# Patient Record
Sex: Male | Born: 1960 | Race: White | Hispanic: No | Marital: Married | Smoking: Never smoker
Health system: Southern US, Community
[De-identification: ages and names within clinical notes are randomized; demographics above are authoritative.]

## PROBLEM LIST (undated history)

## (undated) DIAGNOSIS — F429 Obsessive-compulsive disorder, unspecified: Secondary | ICD-10-CM

## (undated) DIAGNOSIS — I319 Disease of pericardium, unspecified: Secondary | ICD-10-CM

## (undated) HISTORY — DX: Obsessive-compulsive disorder, unspecified: F42.9

## (undated) HISTORY — PX: HEMORROIDECTOMY: SUR656

## (undated) HISTORY — DX: Disease of pericardium, unspecified: I31.9

## (undated) HISTORY — PX: KNEE SURGERY: SHX244

---

## 1998-03-12 ENCOUNTER — Inpatient Hospital Stay (HOSPITAL_COMMUNITY): Admission: EM | Admit: 1998-03-12 | Discharge: 1998-03-14 | Payer: Self-pay | Admitting: *Deleted

## 1998-03-26 ENCOUNTER — Ambulatory Visit (HOSPITAL_COMMUNITY): Admission: RE | Admit: 1998-03-26 | Discharge: 1998-03-26 | Payer: Self-pay | Admitting: Gastroenterology

## 2011-08-08 ENCOUNTER — Other Ambulatory Visit: Payer: Self-pay | Admitting: Occupational Medicine

## 2011-08-08 ENCOUNTER — Ambulatory Visit: Payer: Self-pay

## 2011-08-08 DIAGNOSIS — R52 Pain, unspecified: Secondary | ICD-10-CM

## 2011-11-21 ENCOUNTER — Encounter (HOSPITAL_BASED_OUTPATIENT_CLINIC_OR_DEPARTMENT_OTHER): Payer: Self-pay

## 2011-11-21 ENCOUNTER — Ambulatory Visit (HOSPITAL_BASED_OUTPATIENT_CLINIC_OR_DEPARTMENT_OTHER): Admit: 2011-11-21 | Payer: Self-pay | Admitting: Orthopedic Surgery

## 2011-11-21 SURGERY — ARTHROSCOPY, KNEE, WITH MEDIAL MENISCECTOMY
Anesthesia: Choice | Laterality: Left

## 2013-08-13 IMAGING — CR DG KNEE COMPLETE 4+V*L*
4 series · 4 of 4 positions shown · non-contrast
Comparison: None

CLINICAL DATA: Injured left knee.

LEFT KNEE - COMPLETE 4+ VIEW

[view not recorded (1 of 4)]
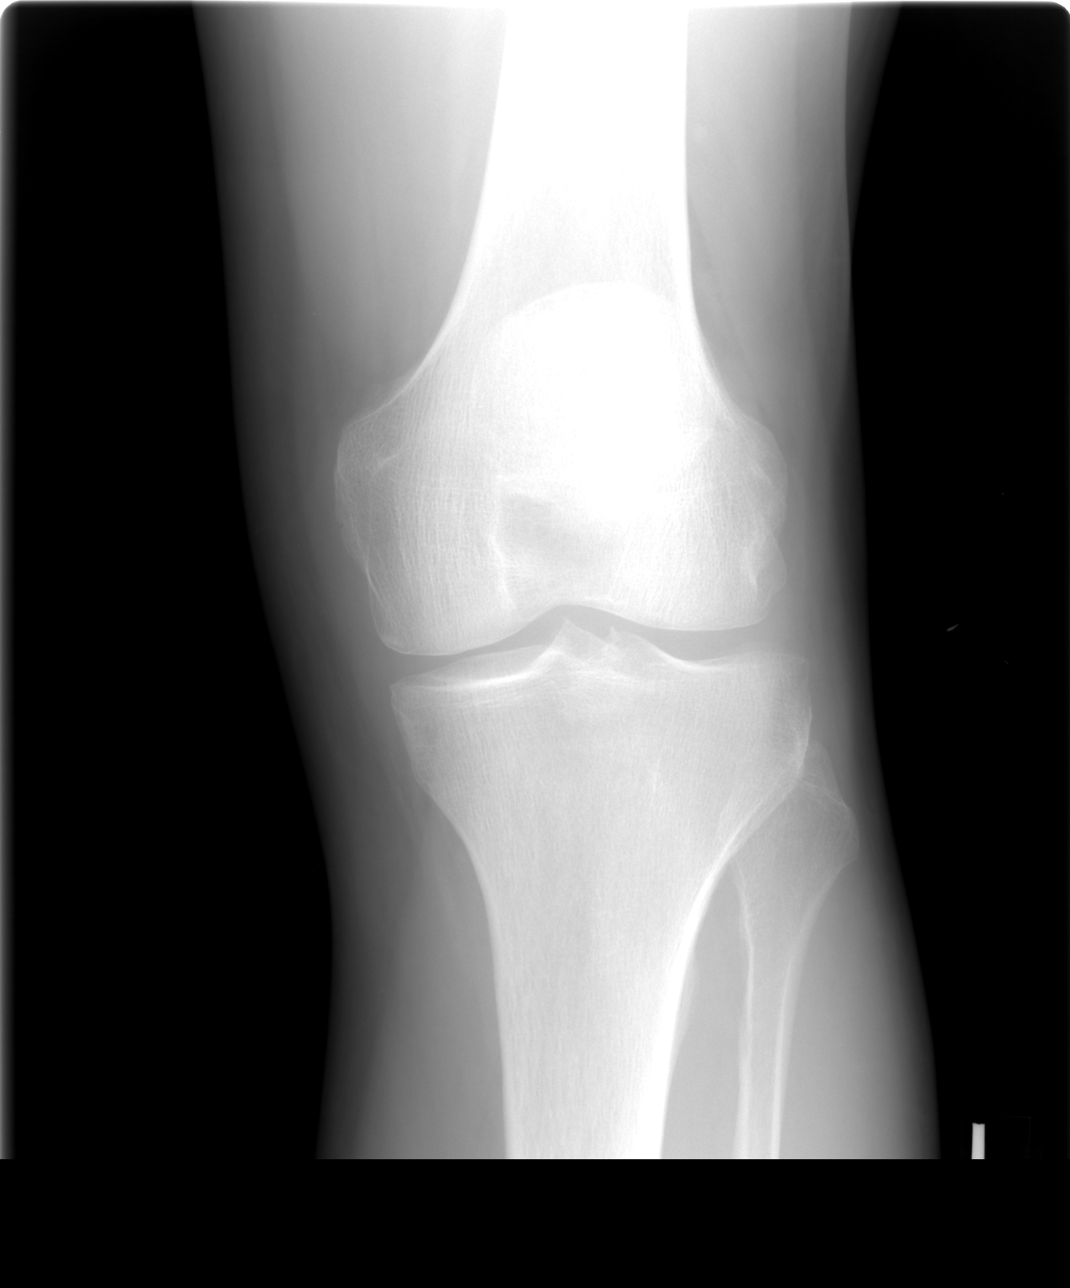

[view not recorded (2 of 4)]
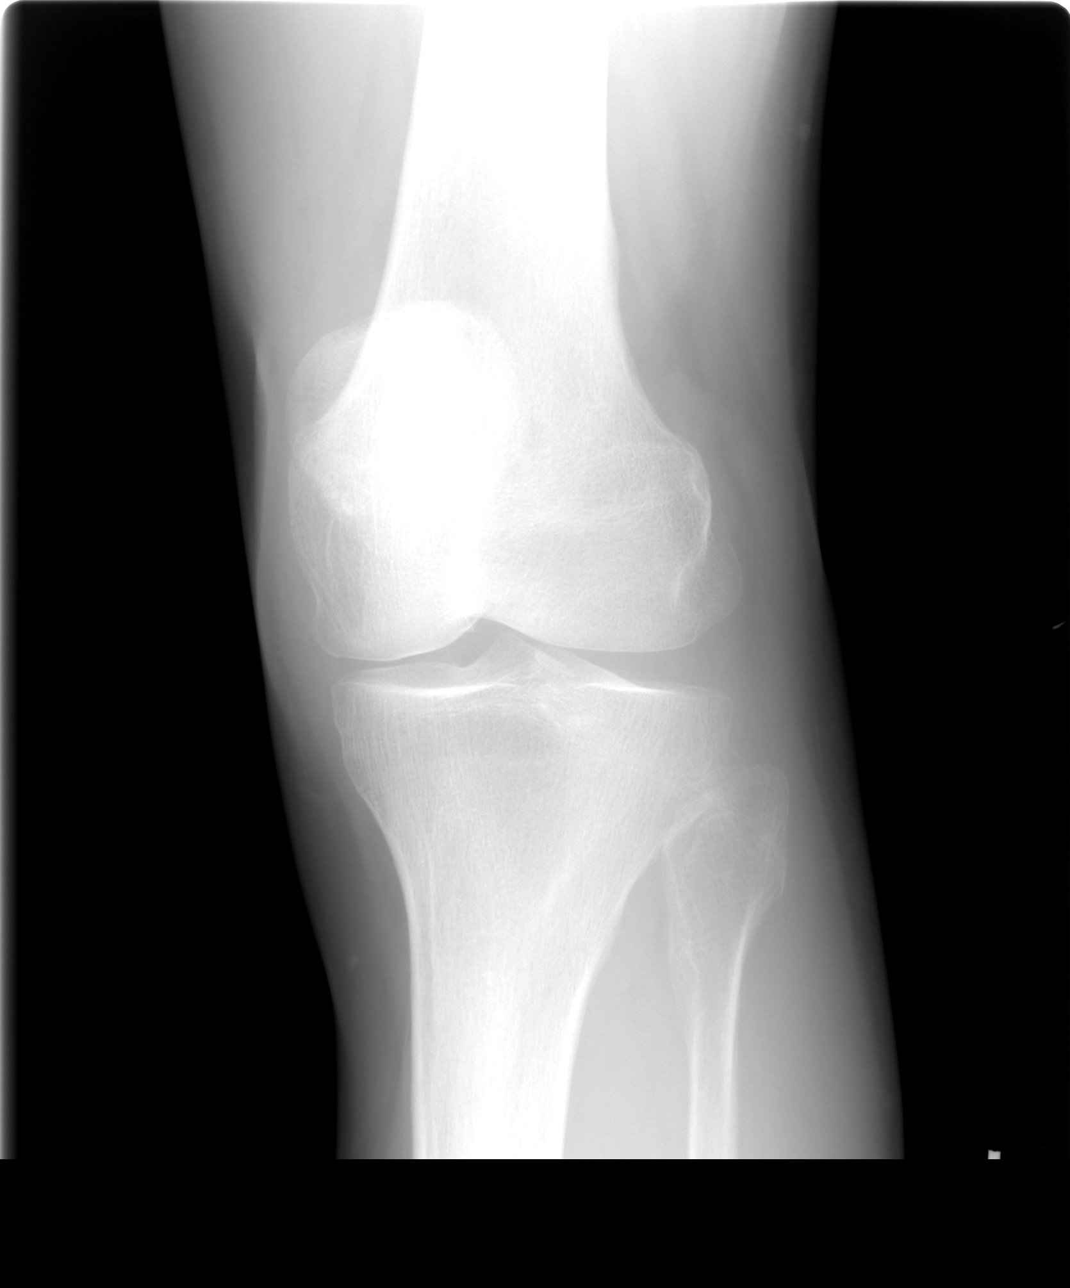

[view not recorded (3 of 4)]
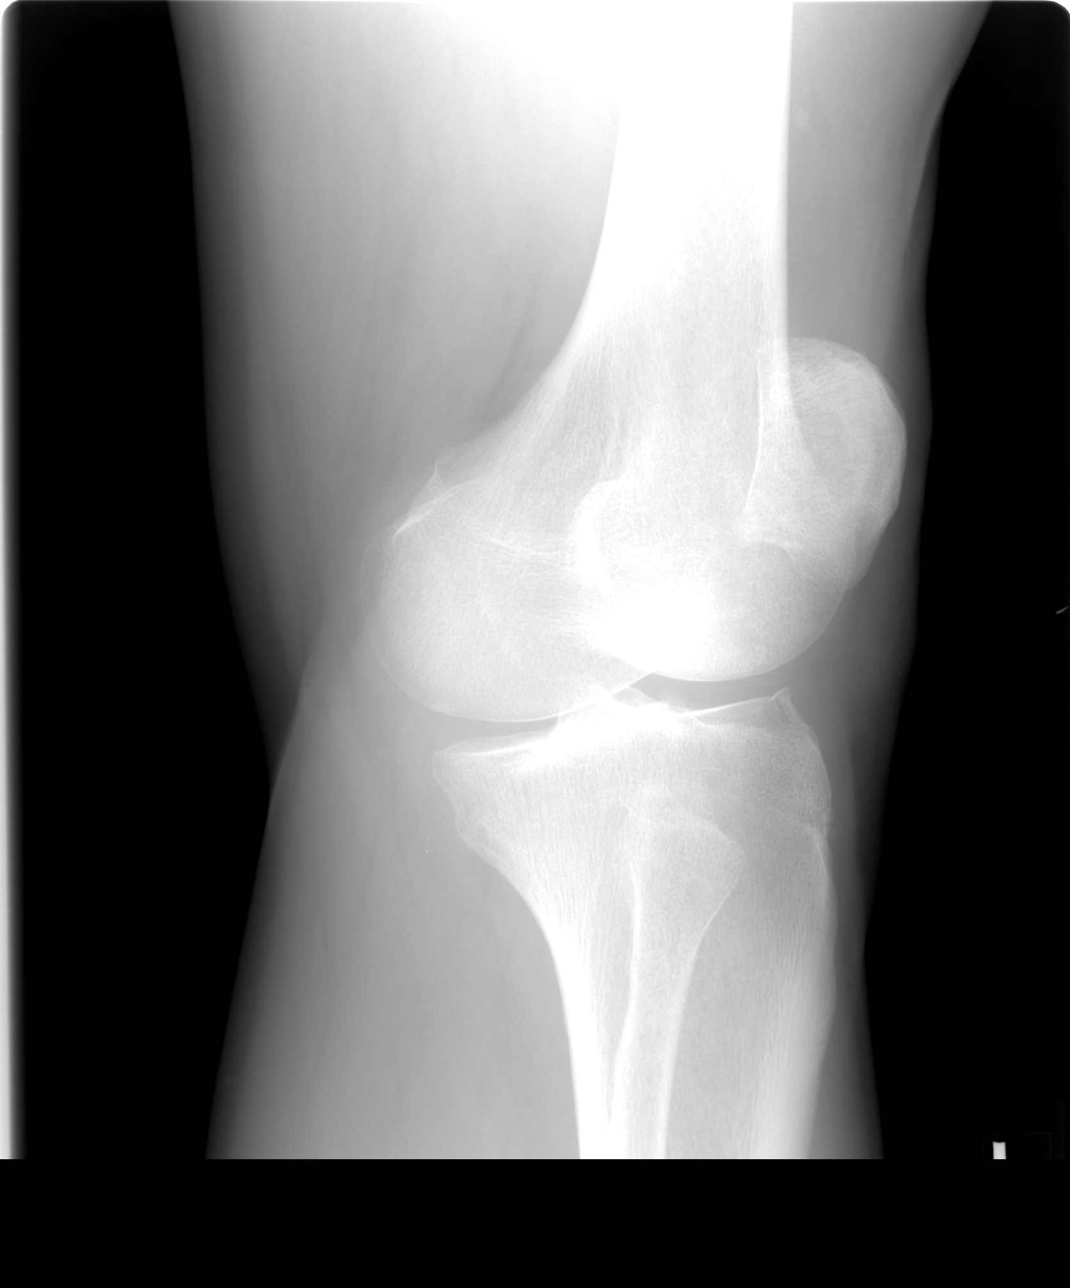

[view not recorded (4 of 4)]
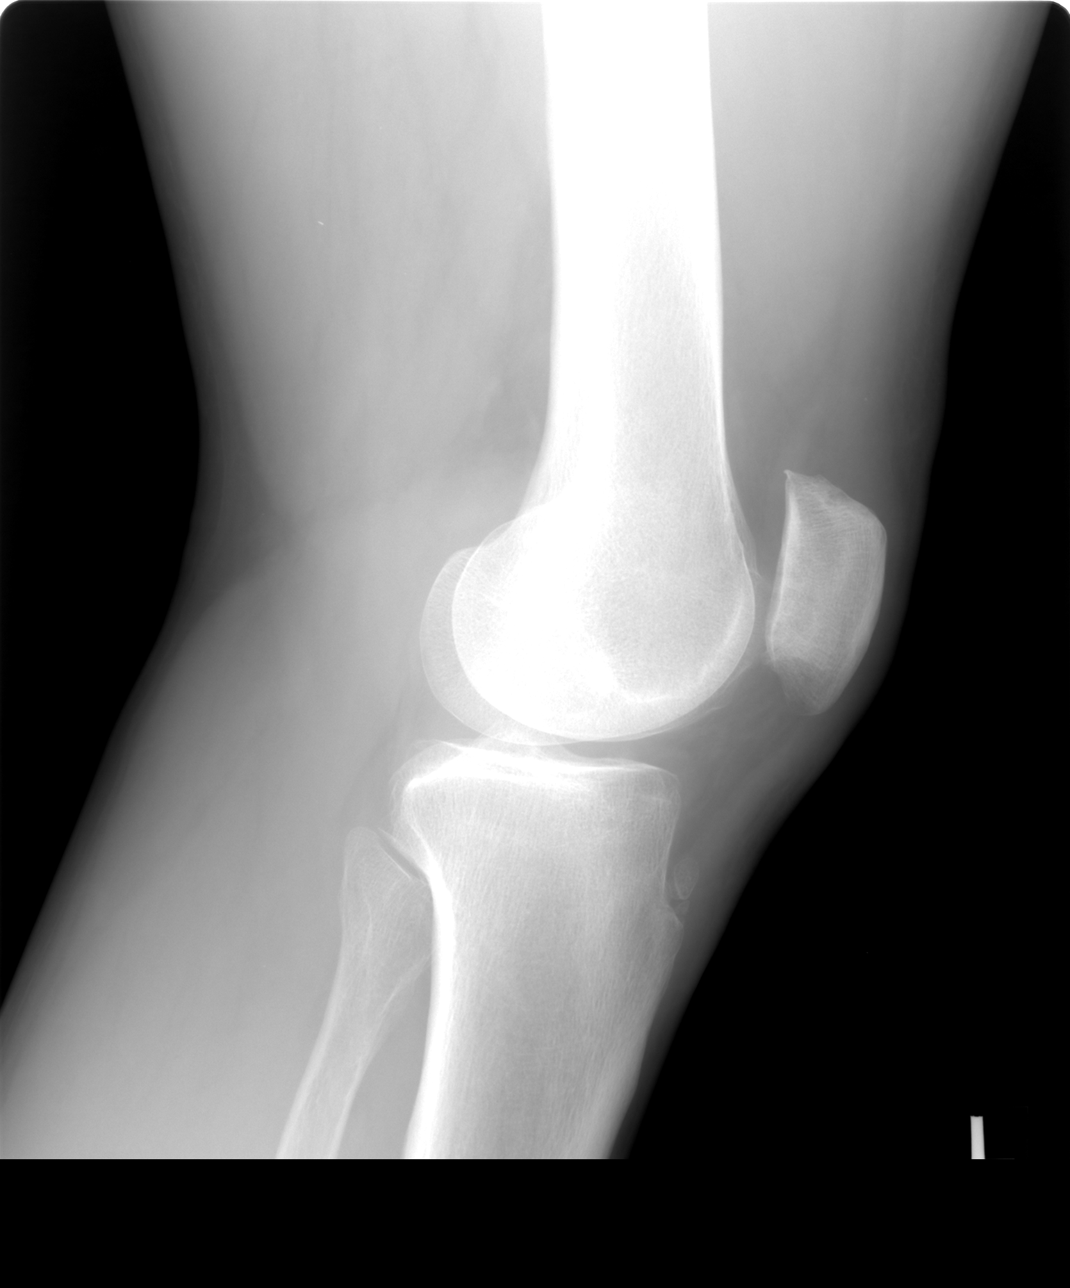

[4 of 4 positions shown; findings below may reference images not displayed]

FINDINGS: The joint spaces are maintained.  Minimal degenerative
changes.  No acute bony findings or osteochondral lesions.  No
definite joint effusion.
IMPRESSION: No acute bony findings.  Minimal degenerative changes.

## 2019-05-26 ENCOUNTER — Ambulatory Visit (INDEPENDENT_AMBULATORY_CARE_PROVIDER_SITE_OTHER): Payer: 59

## 2019-05-26 ENCOUNTER — Telehealth: Payer: Self-pay | Admitting: *Deleted

## 2019-05-26 ENCOUNTER — Ambulatory Visit: Payer: 59 | Admitting: Podiatry

## 2019-05-26 ENCOUNTER — Other Ambulatory Visit: Payer: Self-pay

## 2019-05-26 ENCOUNTER — Other Ambulatory Visit: Payer: Self-pay | Admitting: Podiatry

## 2019-05-26 VITALS — Temp 97.0°F

## 2019-05-26 DIAGNOSIS — M79672 Pain in left foot: Secondary | ICD-10-CM

## 2019-05-26 DIAGNOSIS — M21372 Foot drop, left foot: Secondary | ICD-10-CM

## 2019-05-26 DIAGNOSIS — S93402A Sprain of unspecified ligament of left ankle, initial encounter: Secondary | ICD-10-CM

## 2019-05-26 MED ORDER — DOXYCYCLINE HYCLATE 100 MG PO TABS: 100.0000 mg | ORAL_TABLET | Freq: Two times a day (BID) | ORAL | 0 refills | Status: DC

## 2019-05-26 NOTE — Telephone Encounter (Signed)
Hassan Rowan - [facility] states the Lymes Disease ordered by Dr. Jacqualyn Posey is for spinal fluid. I asked if the Lyme Disease, IgM, Early Test with Reflux was correct and she states that is the blood test.

## 2019-05-26 NOTE — Progress Notes (Addendum)
Subjective:   Patient ID: Laurence Slate, male   DOB: 58 y.o.   MRN: [id number]   HPI 58 year old male presents the office today for concerns of 2.5-week history of acute dropfoot.  Denies any injury or falls at the time. He states that he may have noticed a tick on his foot prior to noticing the dropfoot.  He has no back pain, hip pain, knee pain.  He does state that after he noticed a foot drop he was drinking 1 night he did twist his foot other than that no other injury at the time before the foot drop.  He states he has not had any fevers or chills.  No other falls.  No significant back pain.   Review of Systems  All other systems reviewed and are negative.  No past medical history on file.   Current Outpatient Medications:  .  fluvoxaMINE (LUVOX) 100 MG tablet, TK 3 TS PO QD, Disp: , Rfl:  .  doxycycline (VIBRA-TABS) 100 MG tablet, Take 1 tablet (100 mg total) by mouth 2 (two) times daily., Disp: 20 tablet, Rfl: 0  No Known Allergies      Objective:  Physical Exam  General: AAO x3, NAD  Dermatological: There is a small lesion on the dorsal aspect the left foot He had a tick bite but there is no edema, erythema, drainage or pus.  No signs of infection.  Vascular: Dorsalis Pedis artery and Posterior Tibial artery pedal pulses are 2/4 bilateral with immedate capillary fill time.  There is no pain with calf compression, swelling, warmth, erythema.   Neruologic: Sensation intact with Thornell Mule monofilament.  Vibratory sensation intact.  Musculoskeletal: There is no dorsiflexion present on the left ankle.  Dropfoot is present.  Otherwise some muscle strength intact.  There is no area of tenderness.  Minimal swelling to the lateral aspect of the foot he twisted his foot but no specific tenderness on palpation.  Minimal bruising.  Subjectively he states no pain to the back.  Gait: Unassisted, Nonantalgic.       Assessment:   58 year old male with left acute foot drop,  tick bite     Plan:  -Treatment options discussed including all alternatives, risks, and complications -Etiology of symptoms were discussed -X-rays were obtained and reviewed with the patient.  There is no evidence of acute fracture or stress fracture. -Ankle brace dispensed for now. -We will start doxycycline given tick bite. -Ordered lyme labs, ESR, CRP, CBC, BMP, ANA, RF -Urgent neurology consult; NCV -May need MRI of the back.   Trula Slade DPM

## 2019-05-26 NOTE — Addendum Note (Signed)
Addended by: Celesta Gentile R on: 05/26/2019 05:23 PM   Modules accepted: Orders

## 2019-05-26 NOTE — Addendum Note (Signed)
Addended by: Cranford Mon R on: 05/26/2019 10:27 AM   Modules accepted: Orders

## 2019-05-26 NOTE — Addendum Note (Signed)
Addended by: Harriett Sine D on: 05/26/2019 11:01 AM   Modules accepted: Orders

## 2019-05-26 NOTE — Telephone Encounter (Signed)
I cancelled the order I put in

## 2019-05-27 ENCOUNTER — Encounter: Payer: Self-pay | Admitting: Neurology

## 2019-05-27 ENCOUNTER — Other Ambulatory Visit: Payer: Self-pay

## 2019-05-27 ENCOUNTER — Ambulatory Visit (INDEPENDENT_AMBULATORY_CARE_PROVIDER_SITE_OTHER): Payer: 59 | Admitting: Neurology

## 2019-05-27 VITALS — BP 158/104 | HR 105 | Ht 72.0 in | Wt 200.0 lb

## 2019-05-27 DIAGNOSIS — M21372 Foot drop, left foot: Secondary | ICD-10-CM

## 2019-05-27 NOTE — Patient Instructions (Signed)
We will order nerve study of left leg/foot Further recommendations pending results.

## 2019-05-27 NOTE — Progress Notes (Signed)
NEUROLOGY CONSULTATION NOTE  Doristine MangoRichard B Mackie MRN: [id number] DOB: [birth year]  Referring provider: Ovid CurdMatthew Wagoner, DPM Primary care provider: No PCP  Reason for consult:  Left foot drop  HISTORY OF PRESENT ILLNESS: Mark House is a 58 year old Caucasian man who presents for acute onset left foot drop.  History supplemented by referring provider note.  He developed acute left foot drop about 2 1/2 weeks ago.  No preceding injury involving fall or back pain.  He was sitting on his porch for an extended period of time and noticed he was unable to move his foot.  He was probably sitting with his leg crossed.  He states he often sits with his leg crossed.  He denies radicular pain down the leg or weakness of the leg.  Maybe slight sensory changes on dorsum of foot.  No upper extremity symptoms.  Earlier that day, he was bit by a tick.  While walking, he sprained his ankle and saw the podiatrist.  X-ray of the foot yesterday was personally reviewed and demonstrated no fracture or other abnormality.  He has since been wearing a boot.  PAST MEDICAL HISTORY: Past Medical History:  Diagnosis Date  . OCD (obsessive compulsive disorder)   . Pericarditis     PAST SURGICAL HISTORY: None  MEDICATIONS: Current Outpatient Medications on File Prior to Visit  Medication Sig Dispense Refill  . doxycycline (VIBRA-TABS) 100 MG tablet Take 1 tablet (100 mg total) by mouth 2 (two) times daily. 20 tablet 0  . fluvoxaMINE (LUVOX) 100 MG tablet TK 3 TS PO QD     No current facility-administered medications on file prior to visit.     ALLERGIES: No Known Allergies  FAMILY HISTORY: Family History  Problem Relation Age of Onset  . Cancer - Lung Mother   . Hypertension Mother   . Stroke Mother   . Heart disease Father   . Kidney disease Father   . Cancer Maternal Grandmother     SOCIAL HISTORY: Social History   Socioeconomic History  . Marital status: Married    Spouse name: Not on  file  . Number of children: Not on file  . Years of education: Not on file  . Highest education level: Not on file  Occupational History  . Not on file  Social Needs  . Financial resource strain: Not on file  . Food insecurity    Worry: Not on file    Inability: Not on file  . Transportation needs    Medical: Not on file    Non-medical: Not on file  Tobacco Use  . Smoking status: Not on file  Substance and Sexual Activity  . Alcohol use: Not on file  . Drug use: Not on file  . Sexual activity: Not on file  Lifestyle  . Physical activity    Days per week: Not on file    Minutes per session: Not on file  . Stress: Not on file  Relationships  . Social Musicianconnections    Talks on phone: Not on file    Gets together: Not on file    Attends religious service: Not on file    Active member of club or organization: Not on file    Attends meetings of clubs or organizations: Not on file    Relationship status: Not on file  . Intimate partner violence    Fear of current or ex partner: Not on file    Emotionally abused: Not on file  Physically abused: Not on file    Forced sexual activity: Not on file  Other Topics Concern  . Not on file  Social History Narrative  . Not on file    REVIEW OF SYSTEMS: Constitutional: No fevers, chills, or sweats, no generalized fatigue, change in appetite Eyes: No visual changes, double vision, eye pain Ear, nose and throat: No hearing loss, ear pain, nasal congestion, sore throat Cardiovascular: No chest pain, palpitations Respiratory:  No shortness of breath at rest or with exertion, wheezes GastrointestinaI: No nausea, vomiting, diarrhea, abdominal pain, fecal incontinence Genitourinary:  No dysuria, urinary retention or frequency Musculoskeletal:  No neck pain, back pain Integumentary: No rash, pruritus, skin lesions Neurological: as above Psychiatric: No depression, insomnia, anxiety Endocrine: No palpitations, fatigue, diaphoresis, mood  swings, change in appetite, change in weight, increased thirst Hematologic/Lymphatic:  No purpura, petechiae. Allergic/Immunologic: no itchy/runny eyes, nasal congestion, recent allergic reactions, rashes  PHYSICAL EXAM: Blood pressure (!) 158/104, pulse (!) 105, height 6' (1.829 m), weight 200 lb (90.7 kg), SpO2 98 %. General: No acute distress.  Patient appears well-groomed.   Head:  Normocephalic/atraumatic Eyes:  fundi examined but not visualized Neck: supple, no paraspinal tenderness, full range of motion Back: No paraspinal tenderness Heart: regular rate and rhythm Lungs: Clear to auscultation bilaterally. Vascular: No carotid bruits. Neurological Exam: Mental status: alert and oriented to person, place, and time, recent and remote memory intact, fund of knowledge intact, attention and concentration intact, speech fluent and not dysarthric, language intact. Cranial nerves: CN I: not tested CN II: pupils equal, round and reactive to light, visual fields intact CN III, IV, VI:  full range of motion, no nystagmus, no ptosis CN V: facial sensation intact CN VII: upper and lower face symmetric CN VIII: hearing intact CN IX, X: gag intact, uvula midline CN XI: sternocleidomastoid and trapezius muscles intact CN XII: tongue midline Bulk & Tone: normal, no fasciculations. Motor:  Left foot drop.  Otherwise, 5/5 throughout including foot inversion Sensation:  Pinprick and vibration sensation intact.. Deep Tendon Reflexes:  2+ throughout except 3+ in patellars bilaterally, toes downgoing. Finger to nose testing:  Without dysmetria.   Heel to shin:  Without dysmetria.   Gait:  Left steppage gait.  Able to turn.  Romberg negative.  IMPRESSION: 1. Left foot drop.  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 Foot inversion intact, making L5 radiculopathy less likely.  PLAN: 1.  NCV-EMG of left lower extremity.  Should be scheduled at least next week. 2.   Continue wearing orthotic 3.  Further recommendations pending results.  Thank you for allowing me to take part in the care of this patient.  Metta Clines, DO  CC: Celesta Gentile, DPM

## 2019-05-28 LAB — C-REACTIVE PROTEIN: CRP: 8.3 mg/L — ABNORMAL HIGH (ref <8.0)

## 2019-05-28 LAB — CBC WITH DIFFERENTIAL/PLATELET
Absolute Monocytes: 693 cells/uL (ref 200–950)
Basophils Absolute: 28 cells/uL (ref 0–200)
Basophils Relative: 0.5 %
Eosinophils Absolute: 50 cells/uL (ref 15–500)
Eosinophils Relative: 0.9 %
HCT: 46.7 % (ref 38.5–50.0)
Hemoglobin: 16.9 g/dL (ref 13.2–17.1)
Lymphs Abs: 710 cells/uL — ABNORMAL LOW (ref 850–3900)
MCH: 34.5 pg — ABNORMAL HIGH (ref 27.0–33.0)
MCHC: 36.2 g/dL — ABNORMAL HIGH (ref 32.0–36.0)
MCV: 95.3 fL (ref 80.0–100.0)
MPV: 10.3 fL (ref 7.5–12.5)
Monocytes Relative: 12.6 %
Neutro Abs: 4021 cells/uL (ref 1500–7800)
Neutrophils Relative %: 73.1 %
Platelets: 144 10*3/uL (ref 140–400)
RBC: 4.9 10*6/uL (ref 4.20–5.80)
RDW: 12.5 % (ref 11.0–15.0)
Total Lymphocyte: 12.9 %
WBC: 5.5 10*3/uL (ref 3.8–10.8)

## 2019-05-28 LAB — BASIC METABOLIC PANEL
BUN: 19 mg/dL (ref 7–25)
CO2: 25 mmol/L (ref 20–32)
Calcium: 10 mg/dL (ref 8.6–10.3)
Chloride: 103 mmol/L (ref 98–110)
Creat: 1.33 mg/dL (ref 0.70–1.33)
Glucose, Bld: 93 mg/dL (ref 65–99)
Potassium: 4.1 mmol/L (ref 3.5–5.3)
Sodium: 140 mmol/L (ref 135–146)

## 2019-05-28 LAB — RHEUMATOID FACTOR: Rhuematoid fact SerPl-aCnc: <14 IU/mL (ref <14)

## 2019-05-28 LAB — B. BURGDORFI ANTIBODIES: B burgdorferi Ab IgG+IgM: <0.9 index

## 2019-05-28 LAB — SEDIMENTATION RATE: Sed Rate: 2 mm/h (ref 0–20)

## 2019-05-28 LAB — ANA: Anti Nuclear Antibody (ANA): NEGATIVE

## 2019-05-30 NOTE — Telephone Encounter (Signed)
I called pt and he asked if he needed to continue the doxycycline, which he was given due exposure to tick.

## 2019-05-30 NOTE — Telephone Encounter (Signed)
[place] - Dr. Orpah Melter office receptionist states fax labs to [phone]. Faxed labs as directed.

## 2019-05-30 NOTE — Telephone Encounter (Signed)
Pt states he received the message with the results but has some questions.

## 2019-05-30 NOTE — Telephone Encounter (Signed)
-----   Message from Trula Slade, DPM sent at 05/29/2019 12:15 PM EDT ----- Val- please let him know that the lab work is normal. The only thing is that his CRP is very mildly elevated which is nonspecific. Continue neurology follow-up, NCV

## 2019-05-30 NOTE — Telephone Encounter (Signed)
Left message with Dr. Leigh Aurora review of results and instructions.

## 2019-05-30 NOTE — Telephone Encounter (Signed)
I informed pt Dr. Jacqualyn Posey stated the Lyme Disease result was negative and he did not need to continue the doxycycline.

## 2019-06-03 ENCOUNTER — Other Ambulatory Visit: Payer: Self-pay

## 2019-06-03 ENCOUNTER — Ambulatory Visit: Payer: 59 | Admitting: Podiatry

## 2019-06-03 ENCOUNTER — Encounter: Payer: Self-pay | Admitting: Podiatry

## 2019-06-03 VITALS — Temp 97.2°F

## 2019-06-03 DIAGNOSIS — M21372 Foot drop, left foot: Secondary | ICD-10-CM | POA: Diagnosis not present

## 2019-06-05 ENCOUNTER — Encounter: Payer: 59 | Admitting: Neurology

## 2019-06-10 NOTE — Progress Notes (Signed)
Subjective: 58 year old male presents the office today for evaluation of foot drop.  He has seen neurology edema.  Is caused by compression and crossing his legs.  He also presents today for follow-up evaluation he states he is unchanged not having any pain.  No weakness or falls.  States that he does activities.  Patient states discussed lab results as well. Denies any systemic complaints such as fevers, chills, nausea, vomiting. No acute changes since last appointment, and no other complaints at this time.   Objective: AAO x3, NAD DP/PT pulses palpable bilaterally, CRT less than 3 seconds Drop foot is present on the left side. There is no dorsiflexion noted. No pain. No swelling, erythema. No other new concerns and exam is grossly unchanged.  No open lesions or pre-ulcerative lesions.  No pain with calf compression, swelling, warmth, erythema  Assessment: Left foot, drop foot  Plan: -All treatment options discussed with the patient including all alternatives, risks, complications.  -He is follow-up with neurology.  Discussed blood work results with him today.  Continue neurology evaluation scheduled for nerve conduction test next week.  Discussed physical therapy.  Will await neurology evaluation before starting. -Patient encouraged to call the office with any questions, concerns, change in symptoms.   Trula Slade DPM

## 2019-06-19 ENCOUNTER — Ambulatory Visit (INDEPENDENT_AMBULATORY_CARE_PROVIDER_SITE_OTHER): Payer: 59 | Admitting: Neurology

## 2019-06-19 ENCOUNTER — Other Ambulatory Visit: Payer: Self-pay

## 2019-06-19 DIAGNOSIS — G5732 Lesion of lateral popliteal nerve, left lower limb: Secondary | ICD-10-CM

## 2019-06-19 DIAGNOSIS — M21372 Foot drop, left foot: Secondary | ICD-10-CM | POA: Diagnosis not present

## 2019-06-19 NOTE — Procedures (Signed)
[facility]  [address], [address]  [place], [place] [address] Tel: [phone] Fax:  [phone] Test Date:  06/19/2019  Patient: Mark House DOB: [birth year] Physician: Nita Sickleonika Patel, DO  Sex: Male Height: 6\' 0"  Ref Phys: Shon MilletAdam Jaffe, DO  ID#: [id number] Temp: 34.0C Technician:    Patient Complaints: This is a 58 year old man referred for evaluation of left foot drop which started in late May 2020.  NCV & EMG Findings: Extensive electrodiagnostic testing of the left lower extremity and additional studies of the right shows:  1. Left superficial peroneal sensory response, although within normal limits, is asymmetrically reduced as compared to the right.  Left sural sensory responses within normal limits. 2. Left peroneal motor response at the extensor digitorum brevis and tibialis anterior shows conduction block and markedly slowed conduction velocity at the fibular head (Poplt-B Fib, 21 - 26 m/s).  Left tibial and right peroneal motor responses are within normal limits. 3. Left tibial H reflex study is within normal limits. 4. Active on chronic motor axonal loss changes are seen affecting the anterior tibialis, extensor hallucis longus, and fibularis longus muscles on the left.  Impression: 1. 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 2. There is no evidence of a lumbosacral radiculopathy or sensorimotor polyneuropathy affecting the left lower extremity.   ___________________________ Nita Sickleonika Patel, DO    Nerve Conduction Studies Anti Sensory Summary Table   Site NR Peak (ms) Norm Peak (ms) P-T Amp (V) Norm P-T Amp  Left Sup Peroneal Anti Sensory (Ant Lat Mall)  34C  12 cm    3.2 <4.6 5.3 >4  Right Sup Peroneal Anti Sensory (Ant Lat Mall)  34C  12 cm    2.6 <4.6 11.7 >4  Left Sural Anti Sensory (Lat Mall)  34C  Calf    4.3 <4.6 9.0 >4   Motor Summary Table   Site NR Onset (ms) Norm Onset (ms) O-P Amp (mV) Norm O-P Amp Site1 Site2 Delta-0 (ms) Dist (cm) Vel (m/s) Norm Vel (m/s)  Left Peroneal Motor (Ext Dig Brev)  34C  Ankle    4.3 <6.0 5.7 >2.5 B Fib Ankle 7.5 37.0 49 >40  B Fib    11.8  5.2  Poplt B Fib 4.2 9.0 21 >40  Poplt    16.0  1.0         Right Peroneal Motor (Ext Dig Brev)  34C  Ankle    4.2 <6.0 6.8 >2.5 B Fib Ankle 8.8 40.0 45 >40  B Fib    13.0  6.3  Poplt B Fib 1.7 9.0 53 >40  Poplt    14.7  6.1         Left Peroneal TA Motor (Tib Ant)  34C  Fib Head    2.0 <4.5 4.0 >3 Poplit Fib Head 3.5 9.0 26 >40  Poplit    5.5  0.6         Right Peroneal TA Motor (Tib Ant)  34C  Fib Head    4.2 <4.5 5.2 >3 Poplit Fib Head 1.3 8.0 62 >40  Poplit    5.5  5.1         Left Tibial Motor (Abd Hall Brev)  34C  Ankle    4.8 <6.0 6.5 >4 Knee Ankle 10.0 43.0 43 >40  Knee    14.8  4.4          H Reflex Studies  NR H-Lat (ms) Lat Norm (ms) L-R H-Lat (ms)  Left Tibial (Gastroc)  34C     33.88 <35    EMG   Side Muscle Ins Act Fibs Psw Fasc Number Recrt Dur Dur. Amp Amp. Poly Poly. Comment  Left Lumbo Parasp Low Nml Nml Nml Nml NE - - - - - - - N/A  Left AntTibialis Nml 2+ Nml Nml SMU Rapid All 1+ All 1+ All 1+ N/A  Left ExtHallLong Nml 1+ Nml Nml SMU Rapid Nml Nml Nml Nml Nml Nml N/A  Left Flex Dig Long Nml Nml Nml Nml Nml Nml Nml Nml Nml Nml Nml Nml N/A  Left Fibularis Long Nml 1+ Nml Nml 1- Rapid Nml Nml Nml Nml Nml Nml N/A  Left BicepsFemS Nml Nml Nml Nml Nml Nml Nml Nml Nml Nml Nml Nml N/A  Left GluteusMed Nml Nml Nml Nml Nml Nml Nml Nml Nml Nml Nml Nml N/A  Left Gastroc Nml Nml Nml Nml Nml Nml Nml Nml Nml Nml Nml Nml N/A      Waveforms:

## 2019-06-20 ENCOUNTER — Telehealth: Payer: Self-pay

## 2019-06-20 DIAGNOSIS — M21372 Foot drop, left foot: Secondary | ICD-10-CM

## 2019-06-20 NOTE — Telephone Encounter (Signed)
-----   Message from Pieter Partridge, DO sent at 06/20/2019 12:06 PM EDT ----- Nerve test does show that the nerve is pinched at the knee (likely from crossing his legs).  He is to not cross his legs.  I would like to refer him to physical therapy for foot drop.  I would like him to follow up with me in 2 to 3 months.

## 2019-06-20 NOTE — Telephone Encounter (Signed)
Called and advised Pt of results. Sent rehab order to [facility]. Made follow up appt for Pt.

## 2019-06-24 ENCOUNTER — Ambulatory Visit: Payer: 59 | Admitting: Rehabilitative and Restorative Service Providers"

## 2019-06-24 ENCOUNTER — Encounter: Payer: Self-pay | Admitting: Rehabilitative and Restorative Service Providers"

## 2019-06-24 ENCOUNTER — Other Ambulatory Visit: Payer: Self-pay

## 2019-06-24 DIAGNOSIS — M6281 Muscle weakness (generalized): Secondary | ICD-10-CM

## 2019-06-24 DIAGNOSIS — R29898 Other symptoms and signs involving the musculoskeletal system: Secondary | ICD-10-CM | POA: Diagnosis not present

## 2019-06-24 DIAGNOSIS — R2689 Other abnormalities of gait and mobility: Secondary | ICD-10-CM

## 2019-06-24 NOTE — Therapy (Signed)
[facility] [place], [place], [address] Phone: [phone]   Fax:  [phone]  Physical Therapy Evaluation  Patient Details  Name: Mark House MRN: [id number] Date of Birth: [birth year] Referring Provider (PT): Dr Metta Clines   Encounter Date: 06/24/2019  PT End of Session - 06/24/19 1305    Visit Number  1    Number of Visits  12    Date for PT Re-Evaluation  08/05/19    PT Start Time  1202    PT Stop Time  1252    PT Time Calculation (min)  50 min    Activity Tolerance  Patient tolerated treatment well       Past Medical History:  Diagnosis Date  . OCD (obsessive compulsive disorder)   . Pericarditis     Past Surgical History:  Procedure Laterality Date  . HEMORROIDECTOMY    . KNEE SURGERY Bilateral     There were no vitals filed for this visit.   Subjective Assessment - 06/24/19 1207    Subjective  Patient reports that he noticed inability to lift Lt ankle 05/10/2019 with no known injury. Symptoms persisted and he was referred to neurologist. NCV testing shows Lt common peroneal dysfunction at the fibular head with demyelinating type - no evidence of lumbar radiculopathy    Pertinent History  pericarditis 1997; similar symptoms 1999; OCD; bilat knee surgeries - Rt loose body removed 1979; Lt knee arthroscopy for torn meniscus 2014    Diagnostic tests  NCV    Patient Stated Goals  get ankle moving - flex ankle    Currently in Pain?  --   sometimes pain in the Lt great toe        [facility] PT Assessment - 06/24/19 0001      Assessment   Medical Diagnosis  Lt foot drop     Referring Provider (PT)  Dr Metta Clines    Onset Date/Surgical Date  05/10/19    Hand Dominance  Right    Next MD Visit  09/2019    Prior Therapy  none       Precautions   Precautions  None      Restrictions   Weight Bearing Restrictions  No      Balance Screen   Has the patient fallen in the past 6 months  No     Has the patient had a decrease in activity level because of a fear of falling?   No    Is the patient reluctant to leave their home because of a fear of falling?   No      Home Film/video editor residence    Living Arrangements  Spouse/significant other    Home Access  Stairs to enter    Entrance Stairs-Number of Steps  5    Entrance Stairs-Rails  None    Home Layout  Multi-level      Prior Function   Level of Independence  Independent    Vocation  Retired;Part time employment    Vocation Requirements  works PT at [employer] in [place]    sitting; driving; walking    Leisure  yard work - riding and pushing; gardening; gym 5-6 days/wk cardio and wts (not since [place] [address] shut down)       Sensation   Additional Comments  WFL's per pt report       Posture/Postural Control   Posture Comments  head forward; shoudlers rounded; flexed  forward at hips       AROM   Left Ankle Dorsiflexion  -14    Left Ankle Plantar Flexion  --   WFL's    Left Ankle Inversion  --   WFL's    Left Ankle Eversion  --   limited compared to Rt      Strength   Overall Strength Comments  WFL's bilat hips and knee; Rt ankle     Left Ankle Dorsiflexion  2-/5    Left Ankle Plantar Flexion  5/5    Left Ankle Inversion  5/5    Left Ankle Eversion  3+/5      Flexibility   Hamstrings  mild tightness Lt compared to Rt    ITB  WFL's bilat    Piriformis  tight Lt > Rt       Palpation   Palpation comment  tender to palpation lateral leg at head of fibula       Ambulation/Gait   Ambulation/Gait  Yes    Ambulation/Gait Assistance  7: Independent    Ambulation Distance (Feet)  40 Feet    Assistive device  None    Gait Pattern  Left steppage    Ambulation Surface  Level    Gait Comments  decreased Lt foot DF in swing phase of gait                 Objective measurements completed on examination: See above findings.      [facility] Adult PT Treatment/Exercise - 06/24/19 0001       Electrical Stimulation   Electrical Stimulation Location  lateral leg     Electrical Stimulation Action  NMES    Electrical Stimulation Parameters  to tolerance    Electrical Stimulation Goals  Strength      Ankle Exercises: Stretches   Other Stretch  piriformis stretch Lt supine travell 30 sec x 3 reps       Ankle Exercises: Seated   Toe Raise  10 reps      Ankle Exercises: Supine   Other Supine Ankle Exercises  ankle DF with NEMS x 10 min     Other Supine Ankle Exercises  ankle pumps/circles CW and CCW//alphabet              PT Education - 06/24/19 1247    Education Details  HEP    Person(s) Educated  Patient    Methods  Explanation;Demonstration;Tactile cues;Verbal cues;Handout    Comprehension  Verbalized understanding;Returned demonstration;Verbal cues required;Tactile cues required          PT Long Term Goals - 06/24/19 1311      PT LONG TERM GOAL #1   Title  Increase AROM Lt ankle DF to 0 to 5 deg 08/05/2019    Time  6    Period  Weeks    Status  New      PT LONG TERM GOAL #2   Title  Increase strength Lt ankle DF to 3+/5 to 4-/5 to improve function and gait pattern 08/05/2019    Time  6    Period  Weeks    Status  New      PT LONG TERM GOAL #3   Title  improve agait pattern with patient to demonstrate decreased steppage gait pattern8/25/2020    Time  6    Period  Weeks    Status  New      PT LONG TERM GOAL #4   Title  Independent in HEP 08/05/2019  Time  6    Period  Weeks    Status  New             Plan - 06/24/19 1306    Clinical Impression Statement  Patient presents with signs and symptoms consistent with peroneal nerve injury resulting in Lt foot drop. Symptoms started suddendly upon standing and walking 05/10/2019 with no known injury. NCV confirms nerve injury. Patient has limited AROM and decresaed strenght Lt ankle as well as abnormal gait pattern. He will benefit from PT to address problems identified.    Stability/Clinical  Decision Making  Stable/Uncomplicated    Clinical Decision Making  Low    Rehab Potential  Good    PT Frequency  2x / week    PT Duration  6 weeks    PT Treatment/Interventions  Patient/family education;ADLs/Self Care Home Management;Cryotherapy;Electrical Stimulation;Iontophoresis 4mg /ml Dexamethasone;Moist Heat;Ultrasound;Dry needling;Manual techniques;Neuromuscular re-education;Gait training;Stair training;Functional mobility training;Therapeutic activities;Therapeutic exercise;Balance training    PT Next Visit Plan  review HEP; stretch gastroc/soleus; progress with SLS; continue wiht NMES and work on unit for home use    PT Home Exercise Plan  [id number]    Consulted and Agree with Plan of Care  Patient       Patient will benefit from skilled therapeutic intervention in order to improve the following deficits and impairments:  Decreased strength, Decreased range of motion, Decreased mobility, Abnormal gait  Visit Diagnosis: 1. Muscle weakness (generalized)   2. Ankle weakness   3. Other abnormalities of gait and mobility        Problem List There are no active problems to display for this patient.   Celyn Rober MinionP Holt PT, MPH  06/24/2019, 1:19 PM  [facility]  [address] [place], [place], [address] Phone: [phone]   Fax:  [phone]  Name: Mark House MRN: [id number] Date of Birth: June 12, 1961

## 2019-06-24 NOTE — Patient Instructions (Signed)
Access Code: [email or web address]  URL: [email or web address]  Date: 06/24/2019  Prepared by: Gillermo Murdoch   Exercises  Seated Toe Taps - 10 reps - 1-2 sets - 2 sec hold - 2x daily - 7x weekly  Seated Toe Raise - 10 reps - 1-2 sets - 2 sec hold - 2x daily - 7x weekly  Supine Piriformis Stretch with Foot on Ground - 3 reps - 1 sets - 30 sec hold - 2x daily - 7x weekly  Ankle Pumps in Elevation - 10 reps - 1 sets - 2x daily - 7x weekly  Ankle Circles in Elevation - 10 reps - 1 sets - 2x daily - 7x weekly  Ankle Alphabet in Elevation - 1 reps - 1 sets - 2x daily - 7x weekly

## 2019-06-26 ENCOUNTER — Ambulatory Visit: Payer: 59 | Admitting: Rehabilitative and Restorative Service Providers"

## 2019-06-26 ENCOUNTER — Other Ambulatory Visit: Payer: Self-pay

## 2019-06-26 ENCOUNTER — Encounter: Payer: Self-pay | Admitting: Rehabilitative and Restorative Service Providers"

## 2019-06-26 ENCOUNTER — Encounter (INDEPENDENT_AMBULATORY_CARE_PROVIDER_SITE_OTHER): Payer: Self-pay

## 2019-06-26 DIAGNOSIS — R29898 Other symptoms and signs involving the musculoskeletal system: Secondary | ICD-10-CM | POA: Diagnosis not present

## 2019-06-26 DIAGNOSIS — R2689 Other abnormalities of gait and mobility: Secondary | ICD-10-CM | POA: Diagnosis not present

## 2019-06-26 DIAGNOSIS — M6281 Muscle weakness (generalized): Secondary | ICD-10-CM

## 2019-06-26 NOTE — Therapy (Signed)
[facility] [address] [place] [address] [place], [place], [address] Phone: [phone]   Fax:  [phone]  Physical Therapy Treatment  Patient Details  Name: Mark House MRN: [id number] Date of Birth: July 24, 1961 Referring Provider (PT): Dr Shon MilletAdam Jaffe   Encounter Date: 06/26/2019  PT End of Session - 06/26/19 1007    Visit Number  2    Number of Visits  12    Date for PT Re-Evaluation  08/05/19    PT Start Time  1000    PT Stop Time  1042    PT Time Calculation (min)  42 min    Activity Tolerance  Patient tolerated treatment well       Past Medical History:  Diagnosis Date  . OCD (obsessive compulsive disorder)   . Pericarditis     Past Surgical History:  Procedure Laterality Date  . HEMORROIDECTOMY    . KNEE SURGERY Bilateral     There were no vitals filed for this visit.  Subjective Assessment - 06/26/19 1008    Subjective  Doing exercises at home - questions about the NEMS unit for home.    Currently in Pain?  No/denies                       [facility] Treatment/Exercise - 06/26/19 0001      Electrical Stimulation   Electrical Stimulation Location  lateral leg     Electrical Stimulation Action  NMES    Electrical Stimulation Parameters  to tolerance    Electrical Stimulation Goals  Strength      Ankle Exercises: Stretches   Soleus Stretch  2 reps;30 seconds    Gastroc Stretch  2 reps;30 seconds    Other Stretch  piriformis stretch Lt supine travell 30 sec x 3 reps       Ankle Exercises: Aerobic   Nustep  L5 x 5 min       Ankle Exercises: Standing   Vector Stance  --   reaching forward to touch treatment table 8-10 reps   SLS  30 sec x 3 each LE       Ankle Exercises: Supine   Other Supine Ankle Exercises  ankle DF with NEMS x 10 min     Other Supine Ankle Exercises  ankle pumps/circles CW and CCW//alphabet              PT Education - 06/26/19 1040    Education Details   HEP; discussed "foot up" brace for ankle DF    Person(s) Educated  Patient    Methods  Explanation;Demonstration;Tactile cues;Verbal cues;Handout    Comprehension  Verbalized understanding;Returned demonstration;Verbal cues required;Tactile cues required          PT Long Term Goals - 06/24/19 1311      PT LONG TERM GOAL #1   Title  Increase AROM Lt ankle DF to 0 to 5 deg 08/05/2019    Time  6    Period  Weeks    Status  New      PT LONG TERM GOAL #2   Title  Increase strength Lt ankle DF to 3+/5 to 4-/5 to improve function and gait pattern 08/05/2019    Time  6    Period  Weeks    Status  New      PT LONG TERM GOAL #3   Title  improve agait pattern with patient to demonstrate decreased steppage gait pattern8/25/2020    Time  6    Period  Weeks    Status  New      PT LONG TERM GOAL #4   Title  Independent in HEP 08/05/2019    Time  6    Period  Weeks    Status  New            Plan - 06/26/19 1008    Clinical Impression Statement  Demonstrated all exercises well and added additional exercises without difficulty. Discussed possible trial of "foot up" brace - will attempt to borrow brace for trial next week. Patient considering portable NEMS device for home.    Stability/Clinical Decision Making  Stable/Uncomplicated    Rehab Potential  Good    PT Frequency  2x / week    PT Duration  6 weeks    PT Treatment/Interventions  Patient/family education;ADLs/Self Care Home Management;Cryotherapy;Electrical Stimulation;Iontophoresis 4mg /ml Dexamethasone;Moist Heat;Ultrasound;Dry needling;Manual techniques;Neuromuscular re-education;Gait training;Stair training;Functional mobility training;Therapeutic activities;Therapeutic exercise;Balance training    PT Next Visit Plan  review HEP; continue NMES and work on unit for home use; trial of foot up brace    PT [place] and Agree with Plan of Care  Patient       Patient will benefit from skilled  therapeutic intervention in order to improve the following deficits and impairments:  Decreased strength, Decreased range of motion, Decreased mobility, Abnormal gait  Visit Diagnosis: 1. Muscle weakness (generalized)   2. Ankle weakness   3. Other abnormalities of gait and mobility        Problem List There are no active problems to display for this patient.   [place], MPH  06/26/2019, 10:59 AM  [facility] [place], [place], [address] Phone: [phone]   Fax:  [phone]  Name: Mark House MRN: [id number] Date of Birth: September 05, 1961

## 2019-06-26 NOTE — Patient Instructions (Signed)
Access Code: [email or web address]  URL: [email or web address]  Date: 06/26/2019  Prepared by: Gillermo Murdoch   Exercises  Seated Toe Taps - 10 reps - 1-2 sets - 2 sec hold - 2x daily - 7x weekly  Seated Toe Raise - 10 reps - 1-2 sets - 2 sec hold - 2x daily - 7x weekly  Supine Piriformis Stretch with Foot on Ground - 3 reps - 1 sets - 30 sec hold - 2x daily - 7x weekly  Ankle Pumps in Elevation - 10 reps - 1 sets - 2x daily - 7x weekly  Ankle Circles in Elevation - 10 reps - 1 sets - 2x daily - 7x weekly  Ankle Alphabet in Elevation - 1 reps - 1 sets - 2x daily - 7x weekly  Gastroc Stretch on Wall - 3 reps - 1 sets - 30 sec hold - 2x daily - 7x weekly  Soleus Stretch on Wall - 3 reps - 1 sets - 30 sec hold - 2x daily - 7x weekly  Standing Single Leg Stance with Unilateral Counter Support - 3-5 reps - 1 sets - 30 sec hold - 2x daily - 7x weekly  Forward Reach - 10 reps - 1 sets - 10 sec hold - 2x daily - 7x weekly

## 2019-07-01 ENCOUNTER — Ambulatory Visit (INDEPENDENT_AMBULATORY_CARE_PROVIDER_SITE_OTHER): Payer: 59 | Admitting: Rehabilitative and Restorative Service Providers"

## 2019-07-01 ENCOUNTER — Encounter: Payer: Self-pay | Admitting: Rehabilitative and Restorative Service Providers"

## 2019-07-01 ENCOUNTER — Other Ambulatory Visit: Payer: Self-pay

## 2019-07-01 DIAGNOSIS — M6281 Muscle weakness (generalized): Secondary | ICD-10-CM

## 2019-07-01 DIAGNOSIS — R2689 Other abnormalities of gait and mobility: Secondary | ICD-10-CM

## 2019-07-01 DIAGNOSIS — R29898 Other symptoms and signs involving the musculoskeletal system: Secondary | ICD-10-CM | POA: Diagnosis not present

## 2019-07-01 NOTE — Patient Instructions (Signed)
Access Code: [email or web address]  URL: [email or web address]  Date: 07/01/2019  Prepared by: Gillermo Murdoch   Exercises  Seated Toe Taps - 10 reps - 1-2 sets - 2 sec hold - 2x daily - 7x weekly  Seated Toe Raise - 10 reps - 1-2 sets - 2 sec hold - 2x daily - 7x weekly  Supine Piriformis Stretch with Foot on Ground - 3 reps - 1 sets - 30 sec hold - 2x daily - 7x weekly  Ankle Pumps in Elevation - 10 reps - 1 sets - 2x daily - 7x weekly  Ankle Circles in Elevation - 10 reps - 1 sets - 2x daily - 7x weekly  Ankle Alphabet in Elevation - 1 reps - 1 sets - 2x daily - 7x weekly  Gastroc Stretch on Wall - 3 reps - 1 sets - 30 sec hold - 2x daily - 7x weekly  Soleus Stretch on Wall - 3 reps - 1 sets - 30 sec hold - 2x daily - 7x weekly  Standing Single Leg Stance with Unilateral Counter Support - 3-5 reps - 1 sets - 30 sec hold - 2x daily - 7x weekly  Forward Reach - 10 reps - 1 sets - 10 sec hold - 2x daily - 7x weekly  Squat - 10 reps - 1-2 sets - 2x daily - 7x weekly

## 2019-07-01 NOTE — Therapy (Signed)
[facility] [address] [place] [address] [place], [place], [address] Phone: [phone]   Fax:  [phone]  Physical Therapy Treatment  Patient Details  Name: Mark MangoRichard B Vales MRN: [id number] Date of Birth: Apr 30, 1961 Referring Provider (PT): Dr Shon MilletAdam Jaffe   Encounter Date: 07/01/2019  PT End of Session - 07/01/19 1006    Visit Number  3    Number of Visits  12    Date for PT Re-Evaluation  08/05/19    PT Start Time  1003    PT Stop Time  1046    PT Time Calculation (min)  43 min    Activity Tolerance  Patient tolerated treatment well       Past Medical History:  Diagnosis Date  . OCD (obsessive compulsive disorder)   . Pericarditis     Past Surgical History:  Procedure Laterality Date  . HEMORROIDECTOMY    . KNEE SURGERY Bilateral     There were no vitals filed for this visit.  Subjective Assessment - 07/01/19 1006    Subjective  Working on his exercises at home. Feels that his balance is a little better. Has not ordered the e-stim for home but thinks he may.    Currently in Pain?  No/denies         [facility] - 07/01/19 0001      Assessment   Medical Diagnosis  Lt foot drop     Referring Provider (PT)  Dr Shon MilletAdam Jaffe    Onset Date/Surgical Date  05/10/19    Hand Dominance  Right    Next MD Visit  09/2019    Prior Therapy  none       AROM   Left Ankle Dorsiflexion  -8                   [facility] Adult PT Treatment/Exercise - 07/01/19 0001      Ambulation/Gait   Pre-Gait Activities  trial of foot up brace     Gait Comments  ambulation with and without brace - continued foot slap with decreased deceleration of ankle DF at heel strike - some improvement with brace but brace fit was not good       Insurance claims handlerlectrical Stimulation   Electrical Stimulation Location  lateral leg     Electrical Stimulation Action  NMES    Electrical Stimulation Parameters  to tolerance    Electrical Stimulation Goals   Strength      Ankle Exercises: Aerobic   Nustep  L6 x 5 min       Ankle Exercises: Standing   Vector Stance  --   reach forward to touch surface x 10 each LE    SLS  30 sec x 3 each LE     Other Standing Ankle Exercises  shallow knee bends x 15 keping knees behind toes       Ankle Exercises: Supine   Other Supine Ankle Exercises  ankle DF with NEMS x 10 min     Other Supine Ankle Exercises  ankle pumps/circles CW and CCW//alphabet       Ankle Exercises: Stretches   Slant Board Stretch  3 reps;30 seconds   2 reps level 2; 1 rep level 3             PT Education - 07/01/19 1054    Education Details  HEP    Person(s) Educated  Patient    Methods  Explanation;Demonstration;Tactile cues;Verbal cues;Handout    Comprehension  Verbalized understanding;Returned demonstration;Verbal cues required;Tactile cues required          PT Long Term Goals - 06/24/19 1311      PT LONG TERM GOAL #1   Title  Increase AROM Lt ankle DF to 0 to 5 deg 08/05/2019    Time  6    Period  Weeks    Status  New      PT LONG TERM GOAL #2   Title  Increase strength Lt ankle DF to 3+/5 to 4-/5 to improve function and gait pattern 08/05/2019    Time  6    Period  Weeks    Status  New      PT LONG TERM GOAL #3   Title  improve agait pattern with patient to demonstrate decreased steppage gait pattern8/25/2020    Time  6    Period  Weeks    Status  New      PT LONG TERM GOAL #4   Title  Independent in HEP 08/05/2019    Time  6    Period  Weeks    Status  New            Plan - 07/01/19 1006    Clinical Impression Statement  increased AROM Lt ankle DF. Good contraction with NMES assist. Trial of foot up brace for gait. There continues to be steppage gait Lt LE and poor deceleration of ankle DF at heel strike. Brace did not fit well in the shoes he had on today. He will bring a different pair of shoes to try at next visit.    Stability/Clinical Decision Making  Stable/Uncomplicated    Rehab  Potential  Good    PT Frequency  2x / week    PT Duration  6 weeks    PT Treatment/Interventions  Patient/family education;ADLs/Self Care Home Management;Cryotherapy;Electrical Stimulation;Iontophoresis 4mg /ml Dexamethasone;Moist Heat;Ultrasound;Dry needling;Manual techniques;Neuromuscular re-education;Gait training;Stair training;Functional mobility training;Therapeutic activities;Therapeutic exercise;Balance training    PT Next Visit Plan  review HEP; continue NMES and work on unit for home use; trial of foot up brace    PT [place] and Agree with Plan of Care  Patient       Patient will benefit from skilled therapeutic intervention in order to improve the following deficits and impairments:  Decreased strength, Decreased range of motion, Decreased mobility, Abnormal gait  Visit Diagnosis: 1. Muscle weakness (generalized)   2. Ankle weakness   3. Other abnormalities of gait and mobility        Problem List There are no active problems to display for this patient.   [place], MPH  07/01/2019, 11:05 AM  [facility] [place] [employer] [place], [place], [address] Phone: [phone]   Fax:  [phone]  Name: Mark House MRN: [id number] Date of Birth: [birth year]

## 2019-07-03 ENCOUNTER — Encounter: Payer: Self-pay | Admitting: Rehabilitative and Restorative Service Providers"

## 2019-07-03 ENCOUNTER — Ambulatory Visit: Payer: 59 | Admitting: Rehabilitative and Restorative Service Providers"

## 2019-07-03 ENCOUNTER — Other Ambulatory Visit: Payer: Self-pay

## 2019-07-03 DIAGNOSIS — M6281 Muscle weakness (generalized): Secondary | ICD-10-CM | POA: Diagnosis not present

## 2019-07-03 DIAGNOSIS — R29898 Other symptoms and signs involving the musculoskeletal system: Secondary | ICD-10-CM | POA: Diagnosis not present

## 2019-07-03 DIAGNOSIS — R2689 Other abnormalities of gait and mobility: Secondary | ICD-10-CM

## 2019-07-03 NOTE — Therapy (Signed)
[facility] [address] [place] [address] [place], [place], [address] Phone: [phone]   Fax:  [phone]  Physical Therapy Treatment  Patient Details  Name: Mark MangoRichard B House MRN: [id number] Date of Birth: 1961-07-11 Referring Provider (PT): Dr Shon MilletAdam Jaffe   Encounter Date: 07/03/2019  PT End of Session - 07/03/19 1103    Visit Number  4    Number of Visits  12    Date for PT Re-Evaluation  08/05/19    PT Start Time  1102    PT Stop Time  1147    PT Time Calculation (min)  45 min    Activity Tolerance  Patient tolerated treatment well       Past Medical History:  Diagnosis Date  . OCD (obsessive compulsive disorder)   . Pericarditis     Past Surgical History:  Procedure Laterality Date  . HEMORROIDECTOMY    . KNEE SURGERY Bilateral     There were no vitals filed for this visit.  Subjective Assessment - 07/03/19 1103    Subjective  Patient reports that he is doing exercises at home.    Currently in Pain?  No/denies                       [facility] Treatment/Exercise - 07/03/19 0001      Ambulation/Gait   Pre-Gait Activities  trial of foot up brace with today's shoes     Gait Comments  ambulation with and without brace - continued foot slap with decreased deceleration of ankle DF at heel strike - some improvement with brace but brace but patient is not sure of the benefit       Electrical Stimulation   Electrical Stimulation Location  lateral leg     Electrical Stimulation Action  NMES    Electrical Stimulation Parameters  to tolerance    Electrical Stimulation Goals  Strength      Ankle Exercises: Aerobic   Recumbent Bike  L2 x 7 min       Ankle Exercises: Standing   SLS  30 sec x 3 each LE     Rocker Board  4 minutes   half roll - fwd/back; inversion/eversion; balance    Heel Raises  10 reps    Toe Raise  10 reps      Ankle Exercises: Supine   Isometrics  ankle eversion againxt ball x  10 reps - 2-3 sec hold     T-Band  ankle DF; eversion yellow TB concentric and eccentric work with TB      Other Supine Ankle Exercises  ankle pumps/circles CW and CCW//alphabet       Ankle Exercises: Seated   Towel Crunch  4 reps    Marble Pickup  ~ 1 min       Ankle Exercises: Stretches   Slant Board Stretch  3 reps;30 seconds   2 reps level 2; 1 rep level 3             PT Education - 07/03/19 1141    Education Details  HEP    Person(s) Educated  Patient    Methods  Explanation;Demonstration;Tactile cues;Verbal cues;Handout    Comprehension  Verbalized understanding;Returned demonstration;Verbal cues required;Tactile cues required          PT Long Term Goals - 06/24/19 1311      PT LONG TERM GOAL #1   Title  Increase AROM Lt ankle DF to 0 to 5 deg  08/05/2019    Time  6    Period  Weeks    Status  New      PT LONG TERM GOAL #2   Title  Increase strength Lt ankle DF to 3+/5 to 4-/5 to improve function and gait pattern 08/05/2019    Time  6    Period  Weeks    Status  New      PT LONG TERM GOAL #3   Title  improve agait pattern with patient to demonstrate decreased steppage gait pattern8/25/2020    Time  6    Period  Weeks    Status  New      PT LONG TERM GOAL #4   Title  Independent in HEP 08/05/2019    Time  6    Period  Weeks    Status  New            Plan - 07/03/19 1103    Clinical Impression Statement  Patient has not ordered NMES but states that he probably will. He is not sure about the foot up brace. Added exercises without difficulty today.    Stability/Clinical Decision Making  Stable/Uncomplicated    Rehab Potential  Good    PT Frequency  2x / week    PT Duration  6 weeks    PT Treatment/Interventions  Patient/family education;ADLs/Self Care Home Management;Cryotherapy;Electrical Stimulation;Iontophoresis 4mg /ml Dexamethasone;Moist Heat;Ultrasound;Dry needling;Manual techniques;Neuromuscular re-education;Gait training;Stair  training;Functional mobility training;Therapeutic activities;Therapeutic exercise;Balance training    PT Next Visit Plan  review HEP; continue NMES and work on unit for home use; trial of foot up brace in different size as available    PT [place] and Agree with Plan of Care  Patient       Patient will benefit from skilled therapeutic intervention in order to improve the following deficits and impairments:  Decreased strength, Decreased range of motion, Decreased mobility, Abnormal gait  Visit Diagnosis: 1. Muscle weakness (generalized)   2. Ankle weakness   3. Other abnormalities of gait and mobility        Problem List There are no active problems to display for this patient.   [place], MPH  07/03/2019, 12:20 PM  [facility] [place], [place], [address] Phone: [phone]   Fax:  [phone]  Name: Mark House MRN: [id number] Date of Birth: [birth year]

## 2019-07-03 NOTE — Patient Instructions (Signed)
Access Code: [email or web address]  URL: [email or web address]  Date: 07/03/2019  Prepared by: Gillermo Murdoch   Exercises  Seated Toe Taps - 10 reps - 1-2 sets - 2 sec hold - 2x daily - 7x weekly  Seated Toe Raise - 10 reps - 1-2 sets - 2 sec hold - 2x daily - 7x weekly  Supine Piriformis Stretch with Foot on Ground - 3 reps - 1 sets - 30 sec hold - 2x daily - 7x weekly  Ankle Pumps in Elevation - 10 reps - 1 sets - 2x daily - 7x weekly  Ankle Circles in Elevation - 10 reps - 1 sets - 2x daily - 7x weekly  Ankle Alphabet in Elevation - 1 reps - 1 sets - 2x daily - 7x weekly  Gastroc Stretch on Wall - 3 reps - 1 sets - 30 sec hold - 2x daily - 7x weekly  Soleus Stretch on Wall - 3 reps - 1 sets - 30 sec hold - 2x daily - 7x weekly  Standing Single Leg Stance with Unilateral Counter Support - 3-5 reps - 1 sets - 30 sec hold - 2x daily - 7x weekly  Forward Reach - 10 reps - 1 sets - 10 sec hold - 2x daily - 7x weekly  Squat - 10 reps - 1-2 sets - 2x daily - 7x weekly  Ankle Dorsiflexion with Resistance - 10 reps - 1 sets - 2-3 sec hold - 1x daily - 7x weekly  Ankle Eversion with Resistance - 10 reps - 1 sets - 2-3 sec hold - 1x daily - 7x weekly  Isometric Ankle Eversion at Wall - 10 reps - 1 sets - 2-3 sec hold - 2x daily - 7x weekly  Ankle Inversion Eversion Towel Slide - 5-10 reps - 1 sets - 2x daily - 7x weekly  Seated Marble Pick-Up with Toes - 10-20 reps - 1 sets - 2x daily - 7x weekly  Heel Toe Raises with Unilateral Counter Support - 10 reps - 1 sets - 2-3 sec hold - 2x daily - 7x weekly  Heel Toe Raises with Counter Support - 10 reps - 1 sets - 2-3 sec hold - 2x daily - 7x weekly

## 2019-07-10 ENCOUNTER — Encounter: Payer: 59 | Admitting: Rehabilitative and Restorative Service Providers"

## 2019-07-16 ENCOUNTER — Other Ambulatory Visit: Payer: Self-pay

## 2019-07-16 ENCOUNTER — Encounter: Payer: Self-pay | Admitting: Rehabilitative and Restorative Service Providers"

## 2019-07-16 ENCOUNTER — Ambulatory Visit (INDEPENDENT_AMBULATORY_CARE_PROVIDER_SITE_OTHER): Payer: 59 | Admitting: Rehabilitative and Restorative Service Providers"

## 2019-07-16 DIAGNOSIS — R2689 Other abnormalities of gait and mobility: Secondary | ICD-10-CM

## 2019-07-16 DIAGNOSIS — R29898 Other symptoms and signs involving the musculoskeletal system: Secondary | ICD-10-CM | POA: Diagnosis not present

## 2019-07-16 DIAGNOSIS — M6281 Muscle weakness (generalized): Secondary | ICD-10-CM | POA: Diagnosis not present

## 2019-07-16 NOTE — Patient Instructions (Signed)
Standing on Lt leg on pillow Reaching with arms  Stepping up and down side to side and forward and back on pillow    Standing on Lt leg tapping rt foot fwd/side/back   Standing on Lt leg to reach forward with hands  Diagonally Reach with weight floor to chair height   Standing on lt foot bouncing ball on wall  Bouncing ball

## 2019-07-16 NOTE — Therapy (Addendum)
[facility] [place], [place], [address] Phone: [phone]   Fax:  [phone]  Physical Therapy Treatment  Patient Details  Name: Mark House MRN: [id number] Date of Birth: Sep 24, 1961 Referring Provider (PT): Dr Metta Clines   Encounter Date: 07/16/2019  PT End of Session - 07/16/19 1155    Visit Number  5    Number of Visits  12    Date for PT Re-Evaluation  08/05/19    PT Start Time  1152    PT Stop Time  1242    PT Time Calculation (min)  50 min    Activity Tolerance  Patient tolerated treatment well       Past Medical History:  Diagnosis Date  . OCD (obsessive compulsive disorder)   . Pericarditis     Past Surgical History:  Procedure Laterality Date  . HEMORROIDECTOMY    . KNEE SURGERY Bilateral     There were no vitals filed for this visit.  Subjective Assessment - 07/16/19 1157    Subjective  Ankle is "coming along" Has the electrical stim unit at home now. Wants to learn where to put the electrodes. He has had family probles preventing him from exercising as much as he would like.    Currently in Pain?  No/denies         [facility] PT Assessment - 07/16/19 0001      Assessment   Medical Diagnosis  Lt foot drop     Referring Provider (PT)  Dr Metta Clines    Onset Date/Surgical Date  05/10/19    Hand Dominance  Right    Next MD Visit  09/2019    Prior Therapy  none       AROM   Left Ankle Dorsiflexion  2      Strength   Left Ankle Dorsiflexion  3+/5                   [facility] Adult PT Treatment/Exercise - 07/16/19 0001      Electrical Stimulation   Electrical Stimulation Location  lateral leg     Electrical Stimulation Action  NMES    Electrical Stimulation Parameters  to tolerance     Electrical Stimulation Goals  Strength      Ankle Exercises: Aerobic   Recumbent Bike  L2 x 7 min       Ankle Exercises: Standing   SLS  30 sec x 3 each LE     Rebounder  red  ball fwd/Lt/Rt SLS Lt     Side Shuffle (Round Trip)  20 ft x 4     Tai Chi  BOSU ball balance and stepping up and down     Other Standing Ankle Exercises  heel toe walking fwd/backward walking 20 ft x 4 each       Ankle Exercises: Stretches   Slant Board Stretch  3 reps;30 seconds   2 reps level 2; 1 rep level 3      Ankle Exercises: Plyometrics   Plyometric Exercises  minitramp marching; walking; bouncing; jogging; jumping ~ 5 min total              PT Education - 07/16/19 1246    Education Details  HEP NMES    Person(s) Educated  Patient    Methods  Explanation;Demonstration;Tactile cues;Verbal cues;Handout    Comprehension  Verbalized understanding;Returned demonstration;Verbal cues required;Tactile cues required          PT Long  Term Goals - 07/16/19 1250      PT LONG TERM GOAL #1   Title  Increase AROM Lt ankle DF to 0 to 5 deg 08/05/2019    Time  6    Period  Weeks    Status  Achieved      PT LONG TERM GOAL #2   Title  Increase strength Lt ankle DF to 3+/5 to 4-/5 to improve function and gait pattern 08/05/2019    Time  6    Period  Weeks    Status  Achieved      PT LONG TERM GOAL #3   Title  improve agait pattern with patient to demonstrate decreased steppage gait pattern8/25/2020    Time  6    Period  Weeks    Status  Achieved      PT LONG TERM GOAL #4   Title  Independent in HEP 08/05/2019    Time  6    Period  Weeks    Status  Achieved            Plan - 07/16/19 1157    Clinical Impression Statement  Cotninued improvement in AROM and strength Lt ankle. Excellent progress with goals of therapy accomplished. Patient is confident in coontinuing with independent management of rehab including exercises and NMES which he now has at home.    Stability/Clinical Decision Making  Stable/Uncomplicated    Rehab Potential  Good    PT Frequency  2x / week    PT Duration  6 weeks    PT Treatment/Interventions  Patient/family education;ADLs/Self Care Home  Management;Cryotherapy;Electrical Stimulation;Iontophoresis '4mg'$ /ml Dexamethasone;Moist Heat;Ultrasound;Dry needling;Manual techniques;Neuromuscular re-education;Gait training;Stair training;Functional mobility training;Therapeutic activities;Therapeutic exercise;Balance training    PT Next Visit Plan  d/c to HEP - continue HEP; continue NMES - has unit at home    PT Home Exercise Plan  [id number]    Consulted and Agree with Plan of Care  Patient       Patient will benefit from skilled therapeutic intervention in order to improve the following deficits and impairments:  Decreased strength, Decreased range of motion, Decreased mobility, Abnormal gait  Visit Diagnosis: 1. Muscle weakness (generalized)   2. Ankle weakness   3. Other abnormalities of gait and mobility        Problem List There are no active problems to display for this patient.   Laurel Harnden Nilda Simmer PT, MPH  07/16/2019, 12:51 PM  [facility] [place], [place], [address] Phone: [phone]   Fax:  [phone]  Name: Mark House MRN: [id number] Date of Birth: 02/25/1961  PHYSICAL THERAPY DISCHARGE SUMMARY  Visits from Start of Care: 5  Current functional level related to goals / functional outcomes: See last progress note for discharge status   Remaining deficits: Needs to continue with exercises and e-stim at home   Education / Equipment: HEP  Plan: Patient agrees to discharge.  Patient goals were not met. Patient is being discharged due to being pleased with the current functional level.  ?????    Koty Anctil P. Helene Kelp PT, MPH 08/08/19 8:40 AM

## 2019-08-08 NOTE — Progress Notes (Signed)
Thank you for your help.

## 2019-09-23 NOTE — Progress Notes (Signed)
NEUROLOGY FOLLOW UP OFFICE NOTE  VADIM CENTOLA [id number]  HISTORY OF PRESENT ILLNESS: Mark House is a 58 year old Caucasian man who follows up for acute onset left foot drop.    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  No evidence of lumbosacral radiculopathy.  He was referred to physical therapy.  He found it helpful  HISTORY: He developed acute left foot drop about 2 1/2 weeks ago.  No preceding injury involving fall or back pain.  He was sitting on his porch for an extended period of time and noticed he was unable to move his foot.  He was probably sitting with his leg crossed.  He states he often sits with his leg crossed.  He denies radicular pain down the leg or weakness of the leg.  Maybe slight sensory changes on dorsum of foot.  No upper extremity symptoms.  Earlier that day, he was bit by a tick.  While walking, he sprained his ankle and saw the podiatrist.  X-ray of the foot yesterday was personally reviewed and demonstrated no fracture or other abnormality.  He has since been wearing a boot.  PAST MEDICAL HISTORY: Past Medical History:  Diagnosis Date  . OCD (obsessive compulsive disorder)   . Pericarditis     MEDICATIONS: Current Outpatient Medications on File Prior to Visit  Medication Sig Dispense Refill  . doxycycline (VIBRA-TABS) 100 MG tablet Take 1 tablet (100 mg total) by mouth 2 (two) times daily. (Patient not taking: Reported on 06/24/2019) 20 tablet 0  . fluvoxaMINE (LUVOX) 100 MG tablet 275 mg.      No current facility-administered medications on file prior to visit.     ALLERGIES: No Known Allergies  FAMILY HISTORY: Family History  Problem Relation Age of Onset  . Cancer - Lung Mother   . Hypertension Mother   . Stroke Mother   . Heart disease Father   . Kidney disease Father   . Cancer Maternal Grandmother    SOCIAL HISTORY: Social  History   Socioeconomic History  . Marital status: Married    Spouse name: Not on file  . Number of children: 0  . Years of education: Not on file  . Highest education level: Not on file  Occupational History  . Occupation: Retired  Engineer, production  . Financial resource strain: Not on file  . Food insecurity    Worry: Not on file    Inability: Not on file  . Transportation needs    Medical: Not on file    Non-medical: Not on file  Tobacco Use  . Smoking status: Never Smoker  . Smokeless tobacco: Former Engineer, water and Sexual Activity  . Alcohol use: Yes    Alcohol/week: 21.0 standard drinks    Types: 21 Cans of beer per week  . Drug use: Not Currently  . Sexual activity: Yes    Partners: Female    Birth control/protection: Post-menopausal  Lifestyle  . Physical activity    Days per week: Not on file    Minutes per session: Not on file  . Stress: Not on file  Relationships  . Social Musician on phone: Not on file    Gets together: Not on file    Attends religious service: Not on file    Active member of club or organization: Not on file    Attends meetings of clubs or organizations: Not  on file    Relationship status: Not on file  . Intimate partner violence    Fear of current or ex partner: Not on file    Emotionally abused: Not on file    Physically abused: Not on file    Forced sexual activity: Not on file  Other Topics Concern  . Not on file  Social History Narrative   Retired   Right handed   Lives at home with wife in a two story home with dog and cat.    REVIEW OF SYSTEMS: Constitutional: No fevers, chills, or sweats, no generalized fatigue, change in appetite Eyes: No visual changes, double vision, eye pain Ear, nose and throat: No hearing loss, ear pain, nasal congestion, sore throat Cardiovascular: No chest pain, palpitations Respiratory:  No shortness of breath at rest or with exertion, wheezes GastrointestinaI: No nausea, vomiting,  diarrhea, abdominal pain, fecal incontinence Genitourinary:  No dysuria, urinary retention or frequency Musculoskeletal:  No neck pain, back pain Integumentary: No rash, pruritus, skin lesions Neurological: as above Psychiatric: No depression, insomnia, anxiety Endocrine: No palpitations, fatigue, diaphoresis, mood swings, change in appetite, change in weight, increased thirst Hematologic/Lymphatic:  No purpura, petechiae. Allergic/Immunologic: no itchy/runny eyes, nasal congestion, recent allergic reactions, rashes  PHYSICAL EXAM: Blood pressure 137/84, pulse (!) 103, temperature (!) 97.5 F (36.4 C), height 6' (1.829 m), weight 203 lb (92.1 kg), SpO2 97 %. General: No acute distress.  Patient appears well-groomed.   Head:  Normocephalic/atraumatic Eyes:  Fundi examined but not visualized Neck: supple, no paraspinal tenderness, full range of motion Heart:  Regular rate and rhythm Lungs:  Clear to auscultation bilaterally Back: No paraspinal tenderness Neurological Exam: alert and oriented to person, place, and time. Attention span and concentration intact, recent and remote memory intact, fund of knowledge intact.  Speech fluent and not dysarthric, language intact.  CN II-XII intact. Bulk and tone normal, muscle strength 5/5 throughout.  Sensation to light touch, temperature and vibration intact.  Deep tendon reflexes 2+ throughout, toes downgoing.  Finger to nose and heel to shin testing intact.  Gait normal, mild difficulty walking on left heel. Romberg negative.  IMPRESSION: 1.   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  Much improved.  Trace difficulty with heel walking but otherwise, ankle dorsiflexion and extensor hallucis longus intact and other muscles intact.  NCV-EMG showed only demyelination without axonal loss, indicating good prognosis.  PLAN: 1.  Follow up as needed.  17 minutes spent face to face with patient, over 50% spent discussing  diagnosis, prognosis and management.   Metta Clines, DO  CC: Orpah Melter, MD

## 2019-09-26 ENCOUNTER — Ambulatory Visit: Payer: 59 | Admitting: Neurology

## 2019-09-26 ENCOUNTER — Encounter: Payer: Self-pay | Admitting: Neurology

## 2019-09-26 ENCOUNTER — Other Ambulatory Visit: Payer: Self-pay

## 2019-09-26 VITALS — BP 137/84 | HR 103 | Temp 97.5°F | Ht 72.0 in | Wt 203.0 lb

## 2019-09-26 DIAGNOSIS — G5732 Lesion of lateral popliteal nerve, left lower limb: Secondary | ICD-10-CM | POA: Diagnosis not present

## 2019-09-26 NOTE — Patient Instructions (Signed)
Follow up as needed Try not to cross your legs

## 2019-12-18 ENCOUNTER — Encounter: Payer: Self-pay | Admitting: Psychiatry

## 2019-12-18 ENCOUNTER — Ambulatory Visit (INDEPENDENT_AMBULATORY_CARE_PROVIDER_SITE_OTHER): Payer: 59 | Admitting: Psychiatry

## 2019-12-18 ENCOUNTER — Other Ambulatory Visit: Payer: Self-pay

## 2019-12-18 DIAGNOSIS — F422 Mixed obsessional thoughts and acts: Secondary | ICD-10-CM | POA: Diagnosis not present

## 2019-12-18 MED ORDER — ARIPIPRAZOLE 5 MG PO TABS: 5.0000 mg | ORAL_TABLET | Freq: Every day | ORAL | 1 refills | Status: DC

## 2019-12-18 NOTE — Progress Notes (Signed)
[facility] MD/PA/NP Initial Note  12/18/2019 11:34 AM Mark House  MRN:  [id number]  Chief Complaint:  Chief Complaint    Other; OCD    OCD  HPI: Referred by Gretchen Short for Tx OCD.  OCD since childhood.  Sx then involved adjusting light switches with primary concerns of not wanting to harm others like fire and so would check stove etc.  Retired from Emergency planning/management officer 2016.    Also history of scrupulosity.  Grew up [place] and did a lot repetitive prayers.  Will think he's not really saved and feeling it must be exactly right.    During AIDS epidemenic had obsessions about getting it from people on the streets.  With resultant fear of getting it and giving it to wife.  That resolved.  Recent increase in fear of causing accident and will have to recheck while driving.    First sought tx in 1996.  Saw Dr. Marquis Lunch in [place].  For counseling and meds.   Last 6-7 years seeing NP Shelly Coss in [place].  On fluvoxamine since 1996 mostly at 200 mg daily and recently increased to 300 mg Spring 2020.  Maybe a little better.  No SE noted.    OCD largely in check when working and a bit worse after retiring.  Then Covid hit and it got work.  Handwashing worse.  Since March it's very hard. Started therapy with Gretchen Short in May and it's helped some.  Helps care for 12 yo mother.  No alcohol drug abuse.  No history of mania.  Not currently markedly depressed.  Patient reports stable mood and denies depressed or irritable moods.   Patient denies difficulty with sleep initiation or maintenance. Denies appetite disturbance.  Patient reports that energy and motivation have been good.  Patient denies any difficulty with concentration.  Patient denies any suicidal ideation.    Visit Diagnosis:    ICD-10-CM   1. Mixed obsessional thoughts and acts  F42.2 ARIPiprazole (ABILIFY) 5 MG tablet    Past Psychiatric History:  OCD as noted. Anafranil Buspirone with Luvox ? Rash No SA   Past  Medical History: No new medical problems Past Medical History:  Diagnosis Date  . OCD (obsessive compulsive disorder)   . Pericarditis     Past Surgical History:  Procedure Laterality Date  . HEMORROIDECTOMY    . KNEE SURGERY Bilateral     Family Psychiatric History: F OCD checker and little treatment except BZ. 2 nephews with OCD at 83 and 55 yo.  Family History:  Family History  Problem Relation Age of Onset  . Cancer - Lung Mother   . Hypertension Mother   . Stroke Mother   . Heart disease Father   . Kidney disease Father   . Cancer Maternal Grandmother     Social History:  Ephriam Knuckles attends [employer]. Social History   Socioeconomic History  . Marital status: Married    Spouse name: Not on file  . Number of children: 0  . Years of education: Not on file  . Highest education level: Not on file  Occupational History  . Occupation: Retired  Tobacco Use  . Smoking status: Never Smoker  . Smokeless tobacco: Former Engineer, water and Sexual Activity  . Alcohol use: Yes    Alcohol/week: 21.0 standard drinks    Types: 21 Cans of beer per week  . Drug use: Not Currently  . Sexual activity: Yes    Partners: Female    Birth control/protection: Post-menopausal  Other Topics Concern  . Not on file  Social History Narrative   Retired   Right handed   Lives at home with wife in a two story home with dog and cat.   Caffeine - I cup coffee in am   Social Determinants of Health   Financial Resource Strain:   . Difficulty of Paying Living Expenses: Not on file  Food Insecurity:   . Worried About Programme researcher, broadcasting/film/video in the Last Year: Not on file  . Ran Out of Food in the Last Year: Not on file  Transportation Needs:   . Lack of Transportation (Medical): Not on file  . Lack of Transportation (Non-Medical): Not on file  Physical Activity:   . Days of Exercise per Week: Not on file  . Minutes of Exercise per Session: Not on file  Stress:   . Feeling of Stress : Not  on file  Social Connections:   . Frequency of Communication with Friends and Family: Not on file  . Frequency of Social Gatherings with Friends and Family: Not on file  . Attends Religious Services: Not on file  . Active Member of Clubs or Organizations: Not on file  . Attends Banker Meetings: Not on file  . Marital Status: Not on file    Allergies: No Known Allergies  Metabolic Disorder Labs: No results found for: HGBA1C, MPG No results found for: PROLACTIN No results found for: CHOL, TRIG, HDL, CHOLHDL, VLDL, LDLCALC No results found for: TSH  Therapeutic Level Labs: No results found for: LITHIUM No results found for: VALPROATE No components found for:  CBMZ  Current Medications: Current Outpatient Medications  Medication Sig Dispense Refill  . clonazePAM (KLONOPIN) 0.5 MG tablet 0.5 mg 2 (two) times daily. Usually takes daily    . fluvoxaMINE (LUVOX) 100 MG tablet 275 mg at bedtime. Takes 300mg  daily    . Multiple Vitamin (MULTIVITAMIN) tablet Take 1 tablet by mouth daily.    . ARIPiprazole (ABILIFY) 5 MG tablet Take 1 tablet (5 mg total) by mouth daily. 30 tablet 1   No current facility-administered medications for this visit.    Medication Side Effects: none  Orders placed this visit:  No orders of the defined types were placed in this encounter.   Psychiatric Specialty Exam:  Review of Systems  Constitutional: Negative for activity change, appetite change, chills, diaphoresis, fatigue and fever.  HENT: Negative for congestion, dental problem, drooling, hearing loss, mouth sores, nosebleeds, sinus pain, sore throat and voice change.   Eyes: Negative for photophobia, pain and visual disturbance.  Respiratory: Negative for apnea, cough, choking, chest tightness and shortness of breath.   Cardiovascular: Negative for chest pain, palpitations and leg swelling.  Gastrointestinal: Positive for abdominal pain. Negative for abdominal distention, anal  bleeding, blood in stool, constipation, diarrhea and vomiting.  Endocrine: Negative for cold intolerance, heat intolerance, polydipsia and polyphagia.  Genitourinary: Negative for difficulty urinating, discharge, dysuria, enuresis, flank pain, frequency, genital sores and hematuria.  Musculoskeletal: Positive for arthralgias. Negative for back pain, gait problem, joint swelling and myalgias.  Skin: Negative for color change and rash.  Allergic/Immunologic: Negative for environmental allergies, food allergies and immunocompromised state.  Neurological: Negative for dizziness, tremors, syncope, facial asymmetry, weakness, light-headedness and headaches.  Psychiatric/Behavioral: Negative for agitation, confusion, decreased concentration, dysphoric mood, hallucinations, self-injury, sleep disturbance and suicidal ideas. The patient is nervous/anxious. The patient is not hyperactive.     There were no vitals taken for this visit.There is no  height or weight on file to calculate BMI.  General Appearance: Casual and Neat  Eye Contact:  Good  Speech:  Clear and Coherent and Normal Rate  Volume:  Normal  Mood:  Anxious  Affect:  Appropriate, Congruent and Full Range  Thought Process:  Coherent and Descriptions of Associations: Intact  Orientation:  Full (Time, Place, and Person)  Thought Content: Obsessions   Suicidal Thoughts:  No  Homicidal Thoughts:  No  Memory:  WNL  Judgement:  Good  Insight:  Good  Psychomotor Activity:  Normal  Concentration:  Concentration: Good  Recall:  Good  Fund of Knowledge: Good  Language: Good  Assets:  Communication Skills Desire for Improvement Financial Resources/Insurance Housing Intimacy Leisure Time Physical Health Resilience Social Support Talents/Skills Transportation Vocational/Educational  ADL's:  Intact  Cognition: WNL  Prognosis:  Good   Screenings: MDQ negative  Receiving Psychotherapy: [facility]  Treatment Plan/Recommendations:   Greater than 50% of 60 min face to face time with patient was spent on counseling and coordination of care. We discussed the following: Extensive discusssion of alternative SSRI and pros/cons of each as well as augmentation strategies.of Abilify, risperidone and Anafranil. Lower option glutamate meds include lamotrigine. Disc SE each option.  He choses Abilify 5 mg daily. Discussed potential metabolic side effects associated with atypical antipsychotics, as well as potential risk for movement side effects. Advised pt to contact office if movement side effects occur.   He has not been on other SSRIs and we discussed the alternative of switching but instead we will continue fluvoxamine 300 mg daily.  Extensive education about OCD typical good outcome levels and it's not curable.  Continue psychotherapy.  FU 6 weeks  Lynder Parents, MD, DFAPA       Purnell Shoemaker, MD

## 2020-01-02 ENCOUNTER — Telehealth: Payer: Self-pay | Admitting: Psychiatry

## 2020-01-02 NOTE — Telephone Encounter (Signed)
Pt. States it is helping his OCD a lot. He states that the side effects are he takes it in the morning and he feels drained, I told him he could take it at bedtime. The other side effect is he has trouble going back sleep after waking up to go to the bathroom at night. Please advise.

## 2020-01-02 NOTE — Telephone Encounter (Signed)
Mark House called wanting to discuss Abilify.  He says it is working, but he is having side effects and wants to discuss this.  Please call.  Next appt 01/28/19

## 2020-01-05 NOTE — Telephone Encounter (Signed)
He just started the Abilify 5 mg earlier this month.  Sometimes it will cause some insomnia initially but I think that will resolve.  The advised to take it at bedtime was the correct advice.  It may solve both the insomnia issue and his fatigue.  I would advise to give it 2 more weeks to adjust but if anything worsens let us know.

## 2020-01-05 NOTE — Telephone Encounter (Signed)
Pt. Made aware.

## 2020-01-27 ENCOUNTER — Ambulatory Visit: Payer: 59 | Admitting: Psychiatry

## 2020-01-29 ENCOUNTER — Ambulatory Visit: Payer: 59 | Admitting: Psychiatry

## 2020-02-02 ENCOUNTER — Telehealth: Payer: Self-pay | Admitting: Psychiatry

## 2020-02-02 ENCOUNTER — Other Ambulatory Visit: Payer: Self-pay

## 2020-02-02 DIAGNOSIS — F422 Mixed obsessional thoughts and acts: Secondary | ICD-10-CM

## 2020-02-02 MED ORDER — ARIPIPRAZOLE 5 MG PO TABS: 5.0000 mg | ORAL_TABLET | Freq: Every day | ORAL | 1 refills | Status: DC

## 2020-02-02 NOTE — Telephone Encounter (Signed)
Pt requesting a refill on the Abilify. Stated it will run out before appt scheduled on 3/18. This was a recshedule due to office being closed for inclement weather. Fill at the [facility] on [address].

## 2020-02-02 NOTE — Telephone Encounter (Signed)
Rx for Abilify 5 mg #30 with 1 refill submitted to pharmacy

## 2020-02-17 ENCOUNTER — Other Ambulatory Visit: Payer: Self-pay

## 2020-02-17 ENCOUNTER — Telehealth: Payer: Self-pay | Admitting: Psychiatry

## 2020-02-17 MED ORDER — FLUVOXAMINE MALEATE 100 MG PO TABS: 300.0000 mg | ORAL_TABLET | Freq: Every day | ORAL | 1 refills | Status: DC

## 2020-02-17 NOTE — Telephone Encounter (Signed)
RX for Fluvoxamine 100 mg take 3 tablets daily #90, 1 refill submitted to [facility]

## 2020-02-17 NOTE — Telephone Encounter (Signed)
Pt would like a refill on Fluvoxamine 100mg . Please send to [facility] in [place].

## 2020-02-26 ENCOUNTER — Encounter: Payer: Self-pay | Admitting: Psychiatry

## 2020-02-26 ENCOUNTER — Other Ambulatory Visit: Payer: Self-pay

## 2020-02-26 ENCOUNTER — Ambulatory Visit (INDEPENDENT_AMBULATORY_CARE_PROVIDER_SITE_OTHER): Payer: 59 | Admitting: Psychiatry

## 2020-02-26 DIAGNOSIS — F422 Mixed obsessional thoughts and acts: Secondary | ICD-10-CM

## 2020-02-26 MED ORDER — ARIPIPRAZOLE 10 MG PO TABS: 10.0000 mg | ORAL_TABLET | Freq: Every day | ORAL | 2 refills | Status: DC

## 2020-02-26 MED ORDER — CLONAZEPAM 0.5 MG PO TABS: 0.5000 mg | ORAL_TABLET | Freq: Two times a day (BID) | ORAL | 1 refills | Status: DC

## 2020-02-26 NOTE — Progress Notes (Signed)
Mark House [id number] [birth year] 59 y.o.  Subjective:   Patient ID:  Mark House is a 59 y.o. (DOB 1961/07/05) male.  Chief Complaint:  Chief Complaint  Patient presents with  . Follow-up    Medication Management  . Other    OCD    HPI Mark House presents to the office today for follow-up of OCD was referred by therapist Buena Irish.  First seen December 18, 2019.  He was on fluvoxamine 300 mg daily.  Abilify 5 mg was added to potentiate the antidepressant to treat OCD.  Worse over the last year.   Switched Abilify to HS helped with SE now none at this time. Definietly taken an edge off the OCD.  Till experiencing OCD but it's better by about 50% better.  Would like further benefit and not at good as in the past.  Helped quickly.  Patient reports stable mood and denies depressed or irritable moods.  Patient denies any recent difficulty with anxiety.  Patient denies difficulty with sleep initiation or maintenance 9 hours.. Denies appetite disturbance.  Patient reports that energy and motivation have been good.  Patient denies any difficulty with concentration.  Patient denies any suicidal ideation.    Past Psychiatric Medication Trials: Anafranil Buspirone with Luvox ? Rash Fluvoxamine 300  Review of Systems:  Review of Systems  HENT: Positive for postnasal drip.   Neurological: Negative for tremors and weakness.    Medications: I have reviewed the patient's current medications.  Current Outpatient Medications  Medication Sig Dispense Refill  . ARIPiprazole (ABILIFY) 10 MG tablet Take 1 tablet (10 mg total) by mouth daily. 30 tablet 2  . clonazePAM (KLONOPIN) 0.5 MG tablet Take 1 tablet (0.5 mg total) by mouth 2 (two) times daily. Usually takes daily 60 tablet 1  . fluvoxaMINE (LUVOX) 100 MG tablet Take 3 tablets (300 mg total) by mouth at bedtime. 90 tablet 1  . Multiple Vitamin (MULTIVITAMIN) tablet Take 1 tablet by mouth daily.     No current  facility-administered medications for this visit.    Medication Side Effects: None increased saliva and rhinorrhea  Allergies: No Known Allergies  Past Medical History:  Diagnosis Date  . OCD (obsessive compulsive disorder)   . Pericarditis     Family History  Problem Relation Age of Onset  . Cancer - Lung Mother   . Hypertension Mother   . Stroke Mother   . Heart disease Father   . Kidney disease Father   . Cancer Maternal Grandmother     Social History   Socioeconomic History  . Marital status: Married    Spouse name: Not on file  . Number of children: 0  . Years of education: Not on file  . Highest education level: Not on file  Occupational History  . Occupation: Retired  Tobacco Use  . Smoking status: Never Smoker  . Smokeless tobacco: Former Engineer, water and Sexual Activity  . Alcohol use: Yes    Alcohol/week: 21.0 standard drinks    Types: 21 Cans of beer per week  . Drug use: Not Currently  . Sexual activity: Yes    Partners: Female    Birth control/protection: Post-menopausal  Other Topics Concern  . Not on file  Social History Narrative   Retired   Right handed   Lives at home with wife in a two story home with dog and cat.   Caffeine - I cup coffee in am   Social Determinants of Health  Financial Resource Strain:   . Difficulty of Paying Living Expenses:   Food Insecurity:   . Worried About Charity fundraiser in the Last Year:   . Arboriculturist in the Last Year:   Transportation Needs:   . Film/video editor (Medical):   Marland Kitchen Lack of Transportation (Non-Medical):   Physical Activity:   . Days of Exercise per Week:   . Minutes of Exercise per Session:   Stress:   . Feeling of Stress :   Social Connections:   . Frequency of Communication with Friends and Family:   . Frequency of Social Gatherings with Friends and Family:   . Attends Religious Services:   . Active Member of Clubs or Organizations:   . Attends Archivist  Meetings:   Marland Kitchen Marital Status:   Intimate Partner Violence:   . Fear of Current or Ex-Partner:   . Emotionally Abused:   Marland Kitchen Physically Abused:   . Sexually Abused:     Past Medical History, Surgical history, Social history, and Family history were reviewed and updated as appropriate.   Please see review of systems for further details on the patient's review from today.   Objective:   Physical Exam:  There were no vitals taken for this visit.  Physical Exam Constitutional:      General: He is not in acute distress. Musculoskeletal:        General: No deformity.  Neurological:     Mental Status: He is alert and oriented to person, place, and time.     Coordination: Coordination normal.  Psychiatric:        Attention and Perception: Attention and perception normal. He does not perceive auditory or visual hallucinations.        Mood and Affect: Mood is anxious. Mood is not depressed. Affect is not labile, blunt, angry or inappropriate.        Speech: Speech normal.        Behavior: Behavior normal.        Thought Content: Thought content normal. Thought content is not paranoid or delusional. Thought content does not include homicidal or suicidal ideation. Thought content does not include homicidal or suicidal plan.        Cognition and Memory: Cognition and memory normal.        Judgment: Judgment normal.     Comments: Insight intact Residual OCD, contamination, handwashing showering excessively still     Lab Review:     Component Value Date/Time   NA 140 05/26/2019 1040   K 4.1 05/26/2019 1040   CL 103 05/26/2019 1040   CO2 25 05/26/2019 1040   GLUCOSE 93 05/26/2019 1040   BUN 19 05/26/2019 1040   CREATININE 1.33 05/26/2019 1040   CALCIUM 10.0 05/26/2019 1040       Component Value Date/Time   WBC 5.5 05/26/2019 1040   RBC 4.90 05/26/2019 1040   HGB 16.9 05/26/2019 1040   HCT 46.7 05/26/2019 1040   PLT 144 05/26/2019 1040   MCV 95.3 05/26/2019 1040   MCH 34.5 (H)  05/26/2019 1040   MCHC 36.2 (H) 05/26/2019 1040   RDW 12.5 05/26/2019 1040   LYMPHSABS 710 (L) 05/26/2019 1040   EOSABS 50 05/26/2019 1040   BASOSABS 28 05/26/2019 1040    No results found for: POCLITH, LITHIUM   No results found for: PHENYTOIN, PHENOBARB, VALPROATE, CBMZ   .res Assessment: Plan:    Clarence was seen today for follow-up and other.  Diagnoses  and all orders for this visit:  Mixed obsessional thoughts and acts -     ARIPiprazole (ABILIFY) 10 MG tablet; Take 1 tablet (10 mg total) by mouth daily. -     clonazePAM (KLONOPIN) 0.5 MG tablet; Take 1 tablet (0.5 mg total) by mouth 2 (two) times daily. Usually takes daily  Overall partial response to fluvoxamine plus Abilify for OCD.  Discussed the usual treatment plan as well as the duration required to get optimal results. To further potentiate increase Abilify to 10 mg daily with fluvoxamine 300 mg daily. Discussed potential metabolic side effects associated with atypical antipsychotics, as well as potential risk for movement side effects. Advised pt to contact office if movement side effects occur.    We discussed the short-term risks associated with benzodiazepines including sedation and increased fall risk among others.  Discussed long-term side effect risk including dependence, potential withdrawal symptoms, and the potential eventual dose-related risk of dementia.  But recent studies from 2020 dispute this association between benzodiazepines and dementia risk.  Newwer studies refute risk.  Also took prn clonazepam for anxiety.  It was somewhat helpful and he wants to continue it.  Discussed the advantage of cognitive behavior therapy and is especially exposure response prevention therapy for treatment of OCD.  Follow-up 6 weeks to 8 weeks  Meredith Staggers MD, DFAPA  Please see After Visit Summary for patient specific instructions.  No future appointments.  No orders of the defined types were placed in this  encounter.   -------------------------------

## 2020-03-09 ENCOUNTER — Ambulatory Visit: Payer: 59 | Admitting: Psychiatry

## 2020-04-08 ENCOUNTER — Ambulatory Visit (INDEPENDENT_AMBULATORY_CARE_PROVIDER_SITE_OTHER): Payer: 59 | Admitting: Psychiatry

## 2020-04-08 ENCOUNTER — Other Ambulatory Visit: Payer: Self-pay

## 2020-04-08 ENCOUNTER — Encounter: Payer: Self-pay | Admitting: Psychiatry

## 2020-04-08 DIAGNOSIS — F422 Mixed obsessional thoughts and acts: Secondary | ICD-10-CM | POA: Diagnosis not present

## 2020-04-08 MED ORDER — ARIPIPRAZOLE 2 MG PO TABS: 2.0000 mg | ORAL_TABLET | Freq: Every day | ORAL | 1 refills | Status: DC

## 2020-04-08 MED ORDER — FLUVOXAMINE MALEATE 100 MG PO TABS: 300.0000 mg | ORAL_TABLET | Freq: Every day | ORAL | 1 refills | Status: DC

## 2020-04-08 NOTE — Progress Notes (Signed)
Mark House [id number] [birth year] 59 y.o.  Subjective:   Patient ID:  Mark House is a 59 y.o. (DOB 1961-07-19) male.  Chief Complaint:  Chief Complaint  Patient presents with  . Follow-up    OCC  . Anxiety    HPI Mark House presents to the office today for follow-up of OCD was referred by therapist Buena Irish.  First seen December 18, 2019.  He was on fluvoxamine 300 mg daily.  Abilify 5 mg was added to potentiate the antidepressant to treat OCD.  Worse over the last year.  Last seen February 26, 2020.  The following was noted:  Switched Abilify to HS helped with SE now none at this time.  Definietly taken an edge off the OCD.  Till experiencing OCD but it's better by about 50% better.  Would like further benefit and not at good as in the past.  Helped quickly.To further improve the OCD Abilify was increased to 10 mg daily.  He also continued fluvoxamine 300 mg daily and clonazepam for anxiety and OCD.  April 08, 2020 appointment the following is noted: Abilify 10 after 2 weeks it was too much so cut back to 5 mg daily. ? Depression with lack of ambition.  Tremor.   OCD is better with Abilify and comparable to last time.  OCD 15% better with Abilify fairly quickly.  Typically OCD time spent includes 20 min shower or handwash 3-4 times.  Contamination is focus of OCD.  Less distressed from thoughts but on his mind a good deal of the time.  Patient reports stable mood and denies depressed or irritable moods.  Patient denies any recent difficulty with anxiety.  Patient denies difficulty with sleep initiation or maintenance 9 hours.. Denies appetite disturbance.  Patient reports that energy and motivation have been good.  Patient denies any difficulty with concentration.  Patient denies any suicidal ideation.  Still seeing Mark House for OCD which helped.  Past Psychiatric Medication Trials: Anafranil Buspirone with Luvox ? Rash Fluvoxamine 300  Abilify 10 SE  Review  of Systems:  Review of Systems  Constitutional: Positive for fatigue.  HENT: Positive for postnasal drip.   Neurological: Negative for tremors and weakness.    Medications: I have reviewed the patient's current medications.  Current Outpatient Medications  Medication Sig Dispense Refill  . clonazePAM (KLONOPIN) 0.5 MG tablet Take 1 tablet (0.5 mg total) by mouth 2 (two) times daily. Usually takes daily 60 tablet 1  . fluvoxaMINE (LUVOX) 100 MG tablet Take 3 tablets (300 mg total) by mouth at bedtime. 270 tablet 1  . Multiple Vitamin (MULTIVITAMIN) tablet Take 1 tablet by mouth daily.    . ARIPiprazole (ABILIFY) 2 MG tablet Take 1 tablet (2 mg total) by mouth daily. 30 tablet 1   No current facility-administered medications for this visit.    Medication Side Effects:  increased saliva and rhinorrhea, listless  Allergies: No Known Allergies  Past Medical History:  Diagnosis Date  . OCD (obsessive compulsive disorder)   . Pericarditis     Family History  Problem Relation Age of Onset  . Cancer - Lung Mother   . Hypertension Mother   . Stroke Mother   . Heart disease Father   . Kidney disease Father   . Cancer Maternal Grandmother     Social History   Socioeconomic History  . Marital status: Married    Spouse name: Not on file  . Number of children: 0  . Years of  education: Not on file  . Highest education level: Not on file  Occupational History  . Occupation: Retired  Tobacco Use  . Smoking status: Never Smoker  . Smokeless tobacco: Former Network engineer and Sexual Activity  . Alcohol use: Yes    Alcohol/week: 21.0 standard drinks    Types: 21 Cans of beer per week  . Drug use: Not Currently  . Sexual activity: Yes    Partners: Female    Birth control/protection: Post-menopausal  Other Topics Concern  . Not on file  Social History Narrative   Retired   Right handed   Lives at home with wife in a two story home with dog and cat.   Caffeine - I cup  coffee in am   Social Determinants of Health   Financial Resource Strain:   . Difficulty of Paying Living Expenses:   Food Insecurity:   . Worried About Charity fundraiser in the Last Year:   . Arboriculturist in the Last Year:   Transportation Needs:   . Film/video editor (Medical):   Marland Kitchen Lack of Transportation (Non-Medical):   Physical Activity:   . Days of Exercise per Week:   . Minutes of Exercise per Session:   Stress:   . Feeling of Stress :   Social Connections:   . Frequency of Communication with Friends and Family:   . Frequency of Social Gatherings with Friends and Family:   . Attends Religious Services:   . Active Member of Clubs or Organizations:   . Attends Archivist Meetings:   Marland Kitchen Marital Status:   Intimate Partner Violence:   . Fear of Current or Ex-Partner:   . Emotionally Abused:   Marland Kitchen Physically Abused:   . Sexually Abused:     Past Medical History, Surgical history, Social history, and Family history were reviewed and updated as appropriate.   Please see review of systems for further details on the patient's review from today.   Objective:   Physical Exam:  There were no vitals taken for this visit.  Physical Exam Constitutional:      General: He is not in acute distress. Musculoskeletal:        General: No deformity.  Neurological:     Mental Status: He is alert and oriented to person, place, and time.     Coordination: Coordination normal.  Psychiatric:        Attention and Perception: Attention and perception normal. He does not perceive auditory or visual hallucinations.        Mood and Affect: Mood is anxious. Mood is not depressed. Affect is not labile, blunt, angry or inappropriate.        Speech: Speech normal.        Behavior: Behavior normal.        Thought Content: Thought content normal. Thought content is not paranoid or delusional. Thought content does not include homicidal or suicidal ideation. Thought content does not  include homicidal or suicidal plan.        Cognition and Memory: Cognition and memory normal.        Judgment: Judgment normal.     Comments: Insight intact Residual OCD, contamination, handwashing showering excessively still talkative     Lab Review:     Component Value Date/Time   NA 140 05/26/2019 1040   K 4.1 05/26/2019 1040   CL 103 05/26/2019 1040   CO2 25 05/26/2019 1040   GLUCOSE 93 05/26/2019 1040  BUN 19 05/26/2019 1040   CREATININE 1.33 05/26/2019 1040   CALCIUM 10.0 05/26/2019 1040       Component Value Date/Time   WBC 5.5 05/26/2019 1040   RBC 4.90 05/26/2019 1040   HGB 16.9 05/26/2019 1040   HCT 46.7 05/26/2019 1040   PLT 144 05/26/2019 1040   MCV 95.3 05/26/2019 1040   MCH 34.5 (H) 05/26/2019 1040   MCHC 36.2 (H) 05/26/2019 1040   RDW 12.5 05/26/2019 1040   LYMPHSABS 710 (L) 05/26/2019 1040   EOSABS 50 05/26/2019 1040   BASOSABS 28 05/26/2019 1040    No results found for: POCLITH, LITHIUM   No results found for: PHENYTOIN, PHENOBARB, VALPROATE, CBMZ   .res Assessment: Plan:    Ulrick was seen today for follow-up and anxiety.  Diagnoses and all orders for this visit:  Mixed obsessional thoughts and acts -     ARIPiprazole (ABILIFY) 2 MG tablet; Take 1 tablet (2 mg total) by mouth daily. -     fluvoxaMINE (LUVOX) 100 MG tablet; Take 3 tablets (300 mg total) by mouth at bedtime.  Overall partial response to fluvoxamine plus Abilify for OCD.  Discussed the usual treatment plan as well as the duration required to get optimal results. Rarely 100% free from OCD but he'd like to get it back to the way it was several years ago with minimal disruption.  This time last year OCD was debilitating.  Too fatigued with Abilify 10.  Stop it DT SE.  If sx get worse then restart at 2 mg daily.    Discussed potential metabolic side effects associated with atypical antipsychotics, as well as potential risk for movement side effects. Advised pt to contact office if  movement side effects occur.    We discussed the House-term risks associated with benzodiazepines including sedation and increased fall risk among others.  Discussed long-term side effect risk including dependence, potential withdrawal symptoms, and the potential eventual dose-related risk of dementia.  But recent studies from 2020 dispute this association between benzodiazepines and dementia risk.  Newwer studies refute risk.  Also took prn clonazepam for anxiety.  It was somewhat helpful and he wants to continue it.  Discussed the advantage of cognitive behavior therapy and is especially exposure response prevention therapy for treatment of OCD. Questions about the types of therapy for OCD.  Mild scrupulosity sx.    Follow-up 8 weeks  Meredith Staggers MD, DFAPA  Please see After Visit Summary for patient specific instructions.  No future appointments.  No orders of the defined types were placed in this encounter.   -------------------------------

## 2020-06-08 ENCOUNTER — Ambulatory Visit: Payer: 59 | Admitting: Psychiatry

## 2020-06-10 ENCOUNTER — Ambulatory Visit (INDEPENDENT_AMBULATORY_CARE_PROVIDER_SITE_OTHER): Payer: 59 | Admitting: Psychiatry

## 2020-06-10 ENCOUNTER — Encounter: Payer: Self-pay | Admitting: Psychiatry

## 2020-06-10 ENCOUNTER — Other Ambulatory Visit: Payer: Self-pay

## 2020-06-10 DIAGNOSIS — F422 Mixed obsessional thoughts and acts: Secondary | ICD-10-CM

## 2020-06-10 MED ORDER — SERTRALINE HCL 100 MG PO TABS: 200.0000 mg | ORAL_TABLET | Freq: Every day | ORAL | 2 refills | Status: DC

## 2020-06-10 MED ORDER — ARIPIPRAZOLE 2 MG PO TABS: 2.0000 mg | ORAL_TABLET | Freq: Every day | ORAL | 1 refills | Status: DC

## 2020-06-10 MED ORDER — CLONAZEPAM 0.5 MG PO TABS: 0.5000 mg | ORAL_TABLET | Freq: Two times a day (BID) | ORAL | 2 refills | Status: DC

## 2020-06-10 NOTE — Patient Instructions (Signed)
Start 1/2 sertraline and reduce fluvoxamine to 2 daily for 5 days, then Increase sertraline to 1 daily and reduce fluvoxamine to 1 and 1/2 tablets daily for 5 days, then Increase sertraline to 1 and 1/2 tablets daily and reduce fluvoxamine to 1 daily for 5 days, then Increase sertraline to 2 daily and reduce fluvoxamine to 1/2 tablet for 5 days, Then stop fluvoxamine.

## 2020-06-10 NOTE — Progress Notes (Signed)
[name] [id number] [birth year] 59 y.o.  Subjective:   Patient ID:  Mark House is a 59 y.o. (DOB 15-Aug-1961) male.  Chief Complaint:  Chief Complaint  Patient presents with  . Anxiety    OCD  . Follow-up    HPI Mark House presents to the office today for follow-up of OCD was referred by therapist Buena Irish.  First seen December 18, 2019.  He was on fluvoxamine 300 mg daily.  Abilify 5 mg was added to potentiate the antidepressant to treat OCD.  Worse over the last year.  Last seen February 26, 2020.  The following was noted:  Switched Abilify to HS helped with SE now none at this time.  Definietly taken an edge off the OCD.  Till experiencing OCD but it's better by about 50% better.  Would like further benefit and not at good as in the past.  Helped quickly.To further improve the OCD Abilify was increased to 10 mg daily.  He also continued fluvoxamine 300 mg daily and clonazepam for anxiety and OCD.  April 08, 2020 appointment the following is noted: Abilify 10 after 2 weeks it was too much so cut back to 5 mg daily. ? Depression with lack of ambition.  Tremor.   OCD is better with Abilify and comparable to last time.  OCD 15% better with Abilify fairly quickly.  Typically OCD time spent includes 20 min shower or handwash 3-4 times.  Contamination is focus of OCD.  Less distressed from thoughts but on his mind a good deal of the time. Plan: Too fatigued with Abilify 10.  Stop it DT SE.  If sx get worse then restart at 2 mg daily.    June 10, 2020 appointment with the following noted: Stopped Abilify and the fatigue improved.  After a couple of weeks felt worse with the OCD so restarted Abilify 2 mg and it helped take the edge off. No SE with it.  OCD remains but Abilify helps. Continues fluvoxamine 300 mg daily. Klonopin about 0.5 mg daily prn. OCD more aggravating than anything and doesn't keep him from doing things. Has to check and wash hands and makes his wife a  little anxious. 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   Not depressed.    Patient reports stable mood and denies depressed or irritable moods.   Patient denies difficulty with sleep initiation or maintenance 9 hours.. Denies appetite disturbance.  Patient reports that energy and motivation have been good.  Patient denies any difficulty with concentration.  Patient denies any suicidal ideation.  Still seeing Mark House for OCD which helped.  Past Psychiatric Medication Trials: Anafranil remote and brief Buspirone with Luvox ? Rash Fluvoxamine 300  Abilify 10 SE, 2 mg helped without SE.  Review of Systems:  Review of Systems  Constitutional: Negative for fatigue.  HENT: Positive for postnasal drip.   Gastrointestinal: Positive for constipation. Negative for diarrhea.  Neurological: Negative for tremors and weakness.    Medications: I have reviewed the patient's current medications.  Current Outpatient Medications  Medication Sig Dispense Refill  . ARIPiprazole (ABILIFY) 2 MG tablet Take 1 tablet (2 mg total) by mouth daily. 90 tablet 1  . clonazePAM (KLONOPIN) 0.5 MG tablet Take 1 tablet (0.5 mg total) by mouth 2 (two) times daily. Usually takes daily 60 tablet 2  . fluvoxaMINE (LUVOX) 100 MG tablet Take 3 tablets (300 mg total) by mouth at bedtime. 270 tablet 1  . Multiple Vitamin (  MULTIVITAMIN) tablet Take 1 tablet by mouth daily.    . sertraline (ZOLOFT) 100 MG tablet Take 2 tablets (200 mg total) by mouth daily. 60 tablet 2   No current facility-administered medications for this visit.    Medication Side Effects:  increased saliva and rhinorrhea, listless  Allergies: No Known Allergies  Past Medical History:  Diagnosis Date  . OCD (obsessive compulsive disorder)   . Pericarditis     Family History  Problem Relation Age of Onset  . Cancer - Lung Mother   . Hypertension Mother   . Stroke Mother   . Heart disease Father   . Kidney disease Father   . Cancer Maternal  Grandmother     Social History   Socioeconomic History  . Marital status: Married    Spouse name: Not on file  . Number of children: 0  . Years of education: Not on file  . Highest education level: Not on file  Occupational History  . Occupation: Retired  Tobacco Use  . Smoking status: Never Smoker  . Smokeless tobacco: Former Clinical biochemist  . Vaping Use: Never used  Substance and Sexual Activity  . Alcohol use: Yes    Alcohol/week: 21.0 standard drinks    Types: 21 Cans of beer per week  . Drug use: Not Currently  . Sexual activity: Yes    Partners: Female    Birth control/protection: Post-menopausal  Other Topics Concern  . Not on file  Social History Narrative   Retired   Right handed   Lives at home with wife in a two story home with dog and cat.   Caffeine - I cup coffee in am   Social Determinants of Health   Financial Resource Strain:   . Difficulty of Paying Living Expenses:   Food Insecurity:   . Worried About Programme researcher, broadcasting/film/video in the Last Year:   . Barista in the Last Year:   Transportation Needs:   . Freight forwarder (Medical):   Marland Kitchen Lack of Transportation (Non-Medical):   Physical Activity:   . Days of Exercise per Week:   . Minutes of Exercise per Session:   Stress:   . Feeling of Stress :   Social Connections:   . Frequency of Communication with Friends and Family:   . Frequency of Social Gatherings with Friends and Family:   . Attends Religious Services:   . Active Member of Clubs or Organizations:   . Attends Banker Meetings:   Marland Kitchen Marital Status:   Intimate Partner Violence:   . Fear of Current or Ex-Partner:   . Emotionally Abused:   Marland Kitchen Physically Abused:   . Sexually Abused:     Past Medical History, Surgical history, Social history, and Family history were reviewed and updated as appropriate.   Please see review of systems for further details on the patient's review from today.   Objective:   Physical  Exam:  There were no vitals taken for this visit.  Physical Exam Constitutional:      General: He is not in acute distress. Musculoskeletal:        General: No deformity.  Neurological:     Mental Status: He is alert and oriented to person, place, and time.     Coordination: Coordination normal.  Psychiatric:        Attention and Perception: Attention and perception normal. He does not perceive auditory or visual hallucinations.  Mood and Affect: Mood is anxious. Mood is not depressed. Affect is not labile, blunt, angry or inappropriate.        Speech: Speech normal.        Behavior: Behavior normal.        Thought Content: Thought content normal. Thought content is not paranoid or delusional. Thought content does not include homicidal or suicidal ideation. Thought content does not include homicidal or suicidal plan.        Cognition and Memory: Cognition and memory normal.        Judgment: Judgment normal.     Comments: Insight intact Residual OCD, contamination, handwashing showering excessively still talkative     Lab Review:     Component Value Date/Time   NA 140 05/26/2019 1040   K 4.1 05/26/2019 1040   CL 103 05/26/2019 1040   CO2 25 05/26/2019 1040   GLUCOSE 93 05/26/2019 1040   BUN 19 05/26/2019 1040   CREATININE 1.33 05/26/2019 1040   CALCIUM 10.0 05/26/2019 1040       Component Value Date/Time   WBC 5.5 05/26/2019 1040   RBC 4.90 05/26/2019 1040   HGB 16.9 05/26/2019 1040   HCT 46.7 05/26/2019 1040   PLT 144 05/26/2019 1040   MCV 95.3 05/26/2019 1040   MCH 34.5 (H) 05/26/2019 1040   MCHC 36.2 (H) 05/26/2019 1040   RDW 12.5 05/26/2019 1040   LYMPHSABS 710 (L) 05/26/2019 1040   EOSABS 50 05/26/2019 1040   BASOSABS 28 05/26/2019 1040    No results found for: POCLITH, LITHIUM   No results found for: PHENYTOIN, PHENOBARB, VALPROATE, CBMZ   .res Assessment: Plan:    Maxime was seen today for anxiety and follow-up.  Diagnoses and all orders for  this visit:  Mixed obsessional thoughts and acts -     sertraline (ZOLOFT) 100 MG tablet; Take 2 tablets (200 mg total) by mouth daily. -     ARIPiprazole (ABILIFY) 2 MG tablet; Take 1 tablet (2 mg total) by mouth daily. -     clonazePAM (KLONOPIN) 0.5 MG tablet; Take 1 tablet (0.5 mg total) by mouth 2 (two) times daily. Usually takes daily  Overall partial response to fluvoxamine plus Abilify for OCD.  Discussed the usual treatment plan as well as the duration required to get optimal results. Rarely 100% free from OCD but he'd like to get it back to the way it was several years ago with minimal disruption.  This time last year OCD was debilitating. Still not happy with where I am but not depressed.   He has been better than this in the past. Disc the treatment prognosis and typical outcomes with treatment.  Disc alternatives to Luvox and how switch is done in detail.  Zoloft being the most obvious alternative.  Disc SE.  Start 1/2 sertraline and reduce fluvoxamine to 2 daily for 5 days, then Increase sertraline to 1 daily and reduce fluvoxamine to 1 and 1/2 tablets daily for 5 days, then Increase sertraline to 1 and 1/2 tablets daily and reduce fluvoxamine to 1 daily for 5 days, then Increase sertraline to 2 daily and reduce fluvoxamine to 1/2 tablet for 5 days, Then stop fluvoxamine.  Discussed potential metabolic side effects associated with atypical antipsychotics, as well as potential risk for movement side effects. Advised pt to contact office if movement side effects occur.  Abilify 2 mg daily is helping.   We discussed the House-term risks associated with benzodiazepines including sedation and increased fall risk  among others.  Discussed long-term side effect risk including dependence, potential withdrawal symptoms, and the potential eventual dose-related risk of dementia.  But recent studies from 2020 dispute this association between benzodiazepines and dementia risk.  Newwer studies  refute risk.  Also took prn clonazepam for anxiety.  It was somewhat helpful and he wants to continue it.  Discussed the advantage of cognitive behavior therapy and is especially exposure response prevention therapy for treatment of OCD. Questions about the types of therapy for OCD.  Mild scrupulosity sx.    Follow-up 12 weeks  Meredith Staggersarey Cottle MD, DFAPA  Please see After Visit Summary for patient specific instructions.  No future appointments.  No orders of the defined types were placed in this encounter.   -------------------------------

## 2020-09-14 ENCOUNTER — Encounter: Payer: Self-pay | Admitting: Psychiatry

## 2020-09-14 ENCOUNTER — Ambulatory Visit (INDEPENDENT_AMBULATORY_CARE_PROVIDER_SITE_OTHER): Payer: 59 | Admitting: Psychiatry

## 2020-09-14 ENCOUNTER — Other Ambulatory Visit: Payer: Self-pay

## 2020-09-14 DIAGNOSIS — F422 Mixed obsessional thoughts and acts: Secondary | ICD-10-CM | POA: Diagnosis not present

## 2020-09-14 MED ORDER — CLONAZEPAM 0.5 MG PO TABS: 0.5000 mg | ORAL_TABLET | Freq: Two times a day (BID) | ORAL | 2 refills | Status: AC

## 2020-09-14 MED ORDER — ARIPIPRAZOLE 2 MG PO TABS: 2.0000 mg | ORAL_TABLET | Freq: Every day | ORAL | 1 refills | Status: DC

## 2020-09-14 MED ORDER — SERTRALINE HCL 100 MG PO TABS: ORAL_TABLET | ORAL | 0 refills | Status: DC

## 2020-09-14 NOTE — Progress Notes (Signed)
Mark House [id number] [birth year] 59 y.o.  Subjective:   Patient ID:  Mark House is a 59 y.o. (DOB 07-Aug-1961) male.  Chief Complaint:  Chief Complaint  Patient presents with  . Follow-up  . Anxiety    HPI Mark House presents to the office today for follow-up of OCD was referred by therapist Mark House.  First seen December 18, 2019.  He was on fluvoxamine 300 mg daily.  Abilify 5 mg was added to potentiate the antidepressant to treat OCD.  Worse over the last year.  Last seen February 26, 2020.  The following was noted:  Switched Abilify to HS helped with SE now none at this time.  Definietly taken an edge off the OCD.  Till experiencing OCD but it's better by about 50% better.  Would like further benefit and not at good as in the past.  Helped quickly.To further improve the OCD Abilify was increased to 10 mg daily.  He also continued fluvoxamine 300 mg daily and clonazepam for anxiety and OCD.  April 08, 2020 appointment the following is noted: Abilify 10 after 2 weeks it was too much so cut back to 5 mg daily. ? Depression with lack of ambition.  Tremor.   OCD is better with Abilify and comparable to last time.  OCD 15% better with Abilify fairly quickly.  Typically OCD time spent includes 20 min shower or handwash 3-4 times.  Contamination is focus of OCD.  Less distressed from thoughts but on his mind a good deal of the time. Plan: Too fatigued with Abilify 10.  Stop it DT SE.  If sx get worse then restart at 2 mg daily.    June 10, 2020 appointment with the following noted: Stopped Abilify and the fatigue improved.  After a couple of weeks felt worse with the OCD so restarted Abilify 2 mg and it helped take the edge off. No SE with it.  OCD remains but Abilify helps. Continues fluvoxamine 300 mg daily. Klonopin about 0.5 mg daily prn. OCD more aggravating than anything and doesn't keep him from doing things. Has to check and wash hands and makes his wife a little  anxious. 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   Not depressed.   Plan: Start 1/2 sertraline and reduce fluvoxamine to 2 daily for 5 days, then Increase sertraline to 1 daily and reduce fluvoxamine to 1 and 1/2 tablets daily for 5 days, then Increase sertraline to 1 and 1/2 tablets daily and reduce fluvoxamine to 1 daily for 5 days, then Increase sertraline to 2 daily and reduce fluvoxamine to 1/2 tablet for 5 days, Then stop fluvoxamine.  09/14/20 appt with following noted: Probably better.  No sig SE. OCD lessened and handles it better. No sessions with Mark House right now bc seemed to learn what he needed.  I need to embrace it right now.  Contamination obsessions are the meain problem.  Anxiety low moderate.  Don't get bogged down as much on showering and handwashing. 100% truthfulness obsessions can be a problem. Slight tremor ? New or worse.  Not problematic.  Patient reports stable mood and denies depressed or irritable moods.   Patient denies difficulty with sleep initiation or maintenance 9 hours.. Denies appetite disturbance.  Patient reports that energy and motivation have been good.  Patient denies any difficulty with concentration.  Patient denies any suicidal ideation.  Past Psychiatric Medication Trials: Anafranil remote and brief Buspirone with Luvox ? Rash Fluvoxamine 300  Abilify 10 SE, 2 mg helped without SE.  Review of Systems:  Review of Systems  Constitutional: Negative for fatigue.  HENT: Negative for postnasal drip.   Cardiovascular: Negative for palpitations.  Gastrointestinal: Negative for constipation and diarrhea.  Neurological: Positive for tremors. Negative for weakness.    Medications: I have reviewed the patient's current medications.  Current Outpatient Medications  Medication Sig Dispense Refill  . ARIPiprazole (ABILIFY) 2 MG tablet Take 1 tablet (2 mg total) by mouth daily. 90 tablet 1  . clonazePAM (KLONOPIN) 0.5 MG tablet Take 1 tablet (0.5 mg  total) by mouth 2 (two) times daily. Usually takes daily 60 tablet 2  . Multiple Vitamin (MULTIVITAMIN) tablet Take 1 tablet by mouth daily.    . sertraline (ZOLOFT) 100 MG tablet Take 2 tablets (200 mg total) by mouth daily. 60 tablet 2   No current facility-administered medications for this visit.    Medication Side Effects:  increased saliva and rhinorrhea, listless  Allergies: No Known Allergies  Past Medical History:  Diagnosis Date  . OCD (obsessive compulsive disorder)   . Pericarditis     Family History  Problem Relation Age of Onset  . Cancer - Lung Mother   . Hypertension Mother   . Stroke Mother   . Heart disease Father   . Kidney disease Father   . Cancer Maternal Grandmother     Social History   Socioeconomic History  . Marital status: Married    Spouse name: Not on file  . Number of children: 0  . Years of education: Not on file  . Highest education level: Not on file  Occupational History  . Occupation: Retired  Tobacco Use  . Smoking status: Never Smoker  . Smokeless tobacco: Former Clinical biochemistUser  Vaping Use  . Vaping Use: Never used  Substance and Sexual Activity  . Alcohol use: Yes    Alcohol/week: 21.0 standard drinks    Types: 21 Cans of beer per week  . Drug use: Not Currently  . Sexual activity: Yes    Partners: Female    Birth control/protection: Post-menopausal  Other Topics Concern  . Not on file  Social History Narrative   Retired   Right handed   Lives at home with wife in a two story home with dog and cat.   Caffeine - I cup coffee in am   Social Determinants of Health   Financial Resource Strain:   . Difficulty of Paying Living Expenses: Not on file  Food Insecurity:   . Worried About Programme researcher, broadcasting/film/videounning Out of Food in the Last Year: Not on file  . Ran Out of Food in the Last Year: Not on file  Transportation Needs:   . Lack of Transportation (Medical): Not on file  . Lack of Transportation (Non-Medical): Not on file  Physical Activity:   .  Days of Exercise per Week: Not on file  . Minutes of Exercise per Session: Not on file  Stress:   . Feeling of Stress : Not on file  Social Connections:   . Frequency of Communication with Friends and Family: Not on file  . Frequency of Social Gatherings with Friends and Family: Not on file  . Attends Religious Services: Not on file  . Active Member of Clubs or Organizations: Not on file  . Attends BankerClub or Organization Meetings: Not on file  . Marital Status: Not on file  Intimate Partner Violence:   . Fear of Current or Ex-Partner: Not on file  .  Emotionally Abused: Not on file  . Physically Abused: Not on file  . Sexually Abused: Not on file    Past Medical History, Surgical history, Social history, and Family history were reviewed and updated as appropriate.   Please see review of systems for further details on the patient's review from today.   Objective:   Physical Exam:  There were no vitals taken for this visit.  Physical Exam Constitutional:      General: He is not in acute distress. Musculoskeletal:        General: No deformity.  Neurological:     Mental Status: He is alert and oriented to person, place, and time.     Coordination: Coordination normal.  Psychiatric:        Attention and Perception: Attention and perception normal. He does not perceive auditory or visual hallucinations.        Mood and Affect: Mood is anxious. Mood is not depressed. Affect is not labile, blunt, angry or inappropriate.        Speech: Speech normal.        Behavior: Behavior normal.        Thought Content: Thought content normal. Thought content is not paranoid or delusional. Thought content does not include homicidal or suicidal ideation. Thought content does not include homicidal or suicidal plan.        Cognition and Memory: Cognition and memory normal.        Judgment: Judgment normal.     Comments: Insight intact Residual OCD, contamination, handwashing showering excessively  still Less obsessions.     Lab Review:     Component Value Date/Time   NA 140 05/26/2019 1040   K 4.1 05/26/2019 1040   CL 103 05/26/2019 1040   CO2 25 05/26/2019 1040   GLUCOSE 93 05/26/2019 1040   BUN 19 05/26/2019 1040   CREATININE 1.33 05/26/2019 1040   CALCIUM 10.0 05/26/2019 1040       Component Value Date/Time   WBC 5.5 05/26/2019 1040   RBC 4.90 05/26/2019 1040   HGB 16.9 05/26/2019 1040   HCT 46.7 05/26/2019 1040   PLT 144 05/26/2019 1040   MCV 95.3 05/26/2019 1040   MCH 34.5 (H) 05/26/2019 1040   MCHC 36.2 (H) 05/26/2019 1040   RDW 12.5 05/26/2019 1040   LYMPHSABS 710 (L) 05/26/2019 1040   EOSABS 50 05/26/2019 1040   BASOSABS 28 05/26/2019 1040    No results found for: POCLITH, LITHIUM   No results found for: PHENYTOIN, PHENOBARB, VALPROATE, CBMZ   .res Assessment: Plan:    Mark House was seen today for follow-up and anxiety.  Diagnoses and all orders for this visit:  Mixed obsessional thoughts and acts  Overall partial response to fluvoxamine plus Abilify for OCD.  Discussed the usual treatment plan as well as the duration required to get optimal results. Rarely 100% free from OCD but he'd like to get it back to the way it was several years ago with minimal disruption.  This time last year OCD was debilitating. Disc the treatment prognosis and typical outcomes with treatment.  Cont sertraline.  Disc studies about higher than usual dosages of 200 mg daily.  Disc option increasing.  He's tolerating it well.  Disc serotonergic SE in detail.  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 Increase Sertraline to 2 and 1/2 tablets daily for 2 weeks, then increase to 2 in the AM and 1 in the  PM..  Discussed potential metabolic side effects associated with atypical antipsychotics, as well as potential risk for movement side effects. Advised pt to contact office if movement side effects occur.  Abilify 2 mg daily is  helping.   We discussed the short-term risks associated with benzodiazepines including sedation and increased fall risk among others.  Discussed long-term side effect risk including dependence, potential withdrawal symptoms, and the potential eventual dose-related risk of dementia.  But recent studies from 2020 dispute this association between benzodiazepines and dementia risk.  Newwer studies refute risk.  Also took prn clonazepam for anxiety.  It was somewhat helpful and he wants to continue it.  Discussed the advantage of cognitive behavior therapy and is especially exposure response prevention therapy for treatment of OCD. Questions about the types of therapy for OCD.  Mild scrupulosity sx.    Follow-up 12 weeks  Meredith Staggers MD, DFAPA  Please see After Visit Summary for patient specific instructions.  No future appointments.  No orders of the defined types were placed in this encounter.   -------------------------------

## 2020-09-14 NOTE — Patient Instructions (Signed)
Increase Sertraline to 2 and 1/2 tablets daily for 2 weeks, then increase to 2 in the AM and 1 in the PM..

## 2020-12-15 ENCOUNTER — Ambulatory Visit: Payer: 59 | Admitting: Psychiatry

## 2020-12-16 ENCOUNTER — Telehealth: Payer: Self-pay | Admitting: Psychiatry

## 2020-12-16 ENCOUNTER — Other Ambulatory Visit: Payer: Self-pay | Admitting: Psychiatry

## 2020-12-16 ENCOUNTER — Ambulatory Visit: Payer: 59 | Admitting: Psychiatry

## 2020-12-16 DIAGNOSIS — F422 Mixed obsessional thoughts and acts: Secondary | ICD-10-CM

## 2020-12-16 MED ORDER — SERTRALINE HCL 100 MG PO TABS: ORAL_TABLET | ORAL | 0 refills | Status: DC

## 2020-12-16 NOTE — Telephone Encounter (Signed)
Pt requesting refill for Sertraline 100 mg 2 in AM and 1 in PM. [employer] on file. Apt 2/28 RS from today.

## 2021-02-07 ENCOUNTER — Other Ambulatory Visit: Payer: Self-pay

## 2021-02-07 ENCOUNTER — Ambulatory Visit (INDEPENDENT_AMBULATORY_CARE_PROVIDER_SITE_OTHER): Payer: 59 | Admitting: Psychiatry

## 2021-02-07 ENCOUNTER — Encounter: Payer: Self-pay | Admitting: Psychiatry

## 2021-02-07 DIAGNOSIS — F422 Mixed obsessional thoughts and acts: Secondary | ICD-10-CM | POA: Diagnosis not present

## 2021-02-07 DIAGNOSIS — R251 Tremor, unspecified: Secondary | ICD-10-CM

## 2021-02-07 MED ORDER — PROPRANOLOL HCL 20 MG PO TABS: ORAL_TABLET | ORAL | 1 refills | Status: DC

## 2021-02-07 NOTE — Progress Notes (Signed)
Mark House [id number] Nov 06, 1961 60 y.o.  Subjective:   Patient ID:  Mark MangoRichard B House is a 60 y.o. (DOB Nov 06, 1961) male.  Chief Complaint:  Chief Complaint  Patient presents with  . Follow-up  . Mixed obsessional thoughts and acts  . Anxiety    HPI [facility] presents to the office today for follow-up of OCD was referred by therapist Buena IrishBob Mylan.  First seen December 18, 2019.  He was on fluvoxamine 300 mg daily.  Abilify 5 mg was added to potentiate the antidepressant to treat OCD.  Worse over the last year.  Last seen February 26, 2020.  The following was noted:  Switched Abilify to HS helped with SE now none at this time.  Definietly taken an edge off the OCD.  Till experiencing OCD but it's better by about 50% better.  Would like further benefit and not at good as in the past.  Helped quickly.To further improve the OCD Abilify was increased to 10 mg daily.  He also continued fluvoxamine 300 mg daily and clonazepam for anxiety and OCD.  April 08, 2020 appointment the following is noted: Abilify 10 after 2 weeks it was too much so cut back to 5 mg daily. ? Depression with lack of ambition.  Tremor.   OCD is better with Abilify and comparable to last time.  OCD 15% better with Abilify fairly quickly.  Typically OCD time spent includes 20 min shower or handwash 3-4 times.  Contamination is focus of OCD.  Less distressed from thoughts but on his mind a good deal of the time. Plan: Too fatigued with Abilify 10.  Stop it DT SE.  If sx get worse then restart at 2 mg daily.    June 10, 2020 appointment with the following noted: Stopped Abilify and the fatigue improved.  After a couple of weeks felt worse with the OCD so restarted Abilify 2 mg and it helped take the edge off. No SE with it.  OCD remains but Abilify helps. Continues fluvoxamine 300 mg daily. Klonopin about 0.5 mg daily prn. OCD more aggravating than anything and doesn't keep him from doing things. Has to check and  wash hands and makes his wife a little anxious. 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   Not depressed.   Plan: Start 1/2 sertraline and reduce fluvoxamine to 2 daily for 5 days, then Increase sertraline to 1 daily and reduce fluvoxamine to 1 and 1/2 tablets daily for 5 days, then Increase sertraline to 1 and 1/2 tablets daily and reduce fluvoxamine to 1 daily for 5 days, then Increase sertraline to 2 daily and reduce fluvoxamine to 1/2 tablet for 5 days, Then stop fluvoxamine.  09/14/20 appt with following noted: Probably better.  No sig SE. OCD lessened and handles it better. No sessions with Nadine CountsBob right now bc seemed to learn what he needed.  I need to embrace it right now.  Contamination obsessions are the meain problem.  Anxiety low moderate.  Don't get bogged down as much on showering and handwashing. 100% truthfulness obsessions can be a problem. Slight tremor ? New or worse.  Not problematic. Plan: Increase Sertraline to 2 and 1/2 tablets daily for 2 weeks, then increase to 2 in the AM and 1 in the PM. Continue aripiprazole 2 mg daily.  02/07/2021 appointment with following noted: This appointment was delayed because it had to be rescheduled.  continued meds from above. Not too bad.  Pleased with meds.  Able  to resist OCD better though it's not gone. Stopped therapy bc maxed benefit. No SE noted.    Mild covid in January without problems. Still mild trembling in hands.  1 cup coffee each AM.    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  Patient reports stable mood and denies depressed or irritable moods.   Patient denies difficulty with sleep initiation or maintenance 9 hours.. Denies appetite disturbance.  Patient reports that energy and motivation have been good.  Patient denies any difficulty with concentration.  Patient denies any suicidal ideation.  Past Psychiatric Medication Trials: Anafranil remote and brief Buspirone with Luvox ? Rash Fluvoxamine 300  Sertraline 150 Abilify  10 SE, 2 mg helped without SE.  Review of Systems:  Review of Systems  Constitutional: Negative for fatigue.  HENT: Negative for postnasal drip.   Cardiovascular: Negative for chest pain and palpitations.  Gastrointestinal: Negative for constipation and diarrhea.  Neurological: Positive for tremors. Negative for weakness.    Medications: I have reviewed the patient's current medications.  Current Outpatient Medications  Medication Sig Dispense Refill  . clonazePAM (KLONOPIN) 0.5 MG tablet Take 1 tablet (0.5 mg total) by mouth 2 (two) times daily. Usually takes daily (Patient taking differently: Take 0.5 mg by mouth 2 (two) times daily. PRN) 60 tablet 2  . Multiple Vitamin (MULTIVITAMIN) tablet Take 1 tablet by mouth daily.    . sertraline (ZOLOFT) 100 MG tablet 2 in the AM and 1 in PM 270 tablet 0  . propranolol (INDERAL) 20 MG tablet 1-2 tablets twice daily as needed for tremor 60 tablet 1   No current facility-administered medications for this visit.    Medication Side Effects:  increased saliva and rhinorrhea, listless  Allergies: No Known Allergies  Past Medical History:  Diagnosis Date  . OCD (obsessive compulsive disorder)   . Pericarditis     Family History  Problem Relation Age of Onset  . Cancer - Lung Mother   . Hypertension Mother   . Stroke Mother   . Heart disease Father   . Kidney disease Father   . Cancer Maternal Grandmother     Social History   Socioeconomic History  . Marital status: Married    Spouse name: Not on file  . Number of children: 0  . Years of education: Not on file  . Highest education level: Not on file  Occupational History  . Occupation: Retired  Tobacco Use  . Smoking status: Never Smoker  . Smokeless tobacco: Former Clinical biochemist  . Vaping Use: Never used  Substance and Sexual Activity  . Alcohol use: Yes    Alcohol/week: 21.0 standard drinks    Types: 21 Cans of beer per week  . Drug use: Not Currently  . Sexual  activity: Yes    Partners: Female    Birth control/protection: Post-menopausal  Other Topics Concern  . Not on file  Social History Narrative   Retired   Right handed   Lives at home with wife in a two story home with dog and cat.   Caffeine - I cup coffee in am   Social Determinants of Health   Financial Resource Strain: Not on file  Food Insecurity: Not on file  Transportation Needs: Not on file  Physical Activity: Not on file  Stress: Not on file  Social Connections: Not on file  Intimate Partner Violence: Not on file    Past Medical History, Surgical history, Social history, and Family history were reviewed and  updated as appropriate.   Please see review of systems for further details on the patient's review from today.   Objective:   Physical Exam:  There were no vitals taken for this visit.  Physical Exam Constitutional:      General: He is not in acute distress. Musculoskeletal:        General: No deformity.  Neurological:     Mental Status: He is alert and oriented to person, place, and time.     Coordination: Coordination normal.  Psychiatric:        Attention and Perception: Attention and perception normal. He does not perceive auditory or visual hallucinations.        Mood and Affect: Mood is anxious. Mood is not depressed. Affect is not labile, blunt, angry or inappropriate.        Speech: Speech normal.        Behavior: Behavior normal.        Thought Content: Thought content normal. Thought content is not paranoid or delusional. Thought content does not include homicidal or suicidal ideation. Thought content does not include homicidal or suicidal plan.        Cognition and Memory: Cognition and memory normal.        Judgment: Judgment normal.     Comments: Insight intact Residual OCD, contamination, handwashing showering excessively better Less obsessions. Mild tremor.     Lab Review:     Component Value Date/Time   NA 140 05/26/2019 1040   K  4.1 05/26/2019 1040   CL 103 05/26/2019 1040   CO2 25 05/26/2019 1040   GLUCOSE 93 05/26/2019 1040   BUN 19 05/26/2019 1040   CREATININE 1.33 05/26/2019 1040   CALCIUM 10.0 05/26/2019 1040       Component Value Date/Time   WBC 5.5 05/26/2019 1040   RBC 4.90 05/26/2019 1040   HGB 16.9 05/26/2019 1040   HCT 46.7 05/26/2019 1040   PLT 144 05/26/2019 1040   MCV 95.3 05/26/2019 1040   MCH 34.5 (H) 05/26/2019 1040   MCHC 36.2 (H) 05/26/2019 1040   RDW 12.5 05/26/2019 1040   LYMPHSABS 710 (L) 05/26/2019 1040   EOSABS 50 05/26/2019 1040   BASOSABS 28 05/26/2019 1040    No results found for: POCLITH, LITHIUM   No results found for: PHENYTOIN, PHENOBARB, VALPROATE, CBMZ   .res Assessment: Plan:    Elza was seen today for follow-up, mixed obsessional thoughts and acts and anxiety.  Diagnoses and all orders for this visit:  Mixed obsessional thoughts and acts  Tremor of both outstretched hands -     propranolol (INDERAL) 20 MG tablet; 1-2 tablets twice daily as needed for tremor  Overall partial response to fluvoxamine plus Abilify for OCD.  Discussed the usual treatment plan as well as the duration required to get optimal results. Rarely 100% free from OCD but he'd like to get it back to the way it was several years ago with minimal disruption.  This time last year OCD was debilitating. Disc the treatment prognosis and typical outcomes with treatment.  Cont sertraline.  Disc studies about higher than usual dosages of 200 mg daily.  Disc option increasing.  He's tolerating it well.  Disc serotonergic SE in detail.  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 continue Sertraline 2 in the AM and 1 in the PM..  Discussed potential metabolic side effects associated with atypical antipsychotics, as well as potential risk  for movement side effects. Advised pt to contact office if movement side effects occur.   DC Abilify to see if tremor  better  He may not need Abilify DT benefit with sertraline.  Consider propranolol for tremor .  Looks like essential tremor and not EPS.  Disc dx and options.  He'd prefer no new meds. But will consider  We discussed the short-term risks associated with benzodiazepines including sedation and increased fall risk among others.  Discussed long-term side effect risk including dependence, potential withdrawal symptoms, and the potential eventual dose-related risk of dementia.  But recent studies from 2020 dispute this association between benzodiazepines and dementia risk.  Newwer studies refute risk.  Also took prn clonazepam for anxiety.  It was somewhat helpful and he wants to continue it.  Discussed the advantage of cognitive behavior therapy and is especially exposure response prevention therapy for treatment of OCD. Questions about the types of therapy for OCD.  Mild scrupulosity sx.    Follow-up 12 weeks  Meredith Staggers MD, DFAPA  Please see After Visit Summary for patient specific instructions.  No future appointments.  No orders of the defined types were placed in this encounter.   -------------------------------

## 2021-02-07 NOTE — Patient Instructions (Signed)
Option propranolol for tremor

## 2021-03-23 ENCOUNTER — Other Ambulatory Visit: Payer: Self-pay | Admitting: Psychiatry

## 2021-03-23 DIAGNOSIS — F422 Mixed obsessional thoughts and acts: Secondary | ICD-10-CM

## 2021-03-24 NOTE — Telephone Encounter (Signed)
Appt 05/03/21

## 2021-05-03 ENCOUNTER — Ambulatory Visit: Payer: 59 | Admitting: Psychiatry

## 2021-05-18 ENCOUNTER — Encounter: Payer: Self-pay | Admitting: Psychiatry

## 2021-05-18 ENCOUNTER — Other Ambulatory Visit: Payer: Self-pay

## 2021-05-18 ENCOUNTER — Ambulatory Visit (INDEPENDENT_AMBULATORY_CARE_PROVIDER_SITE_OTHER): Payer: 59 | Admitting: Psychiatry

## 2021-05-18 DIAGNOSIS — F422 Mixed obsessional thoughts and acts: Secondary | ICD-10-CM | POA: Diagnosis not present

## 2021-05-18 DIAGNOSIS — R251 Tremor, unspecified: Secondary | ICD-10-CM | POA: Diagnosis not present

## 2021-05-18 MED ORDER — SERTRALINE HCL 100 MG PO TABS: ORAL_TABLET | ORAL | 1 refills | Status: DC

## 2021-05-18 NOTE — Progress Notes (Signed)
Mark House [id number] [birth year] 60 y.o.  Subjective:   Mark House ID:  Mark House is a 60 y.o. (DOB 09/24/1961) male.  Chief Complaint:  Chief Complaint  Mark House presents with  . Follow-up  . Mixed obsessional thoughts and acts  . Medication Problem    HPI Mark House presents to the office today for follow-up of OCD was referred by therapist Mark House.  First seen December 18, 2019.  Mark House was on fluvoxamine 300 mg daily.  Abilify 5 mg was added to potentiate the antidepressant to treat OCD.  Worse over the last year.  Last seen February 26, 2020.  The following was noted:  Switched Abilify to HS helped with SE now none at this time.  Definietly taken an edge off the OCD.  Till experiencing OCD but it's better by about 50% better.  Would like further benefit and not at good as in the past.  Helped quickly.To further improve the OCD Abilify was increased to 10 mg daily.  Mark House also continued fluvoxamine 300 mg daily and clonazepam for anxiety and OCD.  April 08, 2020 appointment the following is noted: Abilify 10 after 2 weeks it was too much so cut back to 5 mg daily. ? Depression with lack of ambition.  Tremor.   OCD is better with Abilify and comparable to last time.  OCD 15% better with Abilify fairly quickly.  Typically OCD time spent includes 20 min shower or handwash 3-4 times.  Contamination is focus of OCD.  Less distressed from thoughts but on his mind a good deal of the time. Plan: Too fatigued with Abilify 10.  Stop it DT SE.  If sx get worse then restart at 2 mg daily.    June 10, 2020 appointment with the following noted: Stopped Abilify and the fatigue improved.  After a couple of weeks felt worse with the OCD so restarted Abilify 2 mg and it helped take the edge off. No SE with it.  OCD remains but Abilify helps. Continues fluvoxamine 300 mg daily. Klonopin about 0.5 mg daily prn. OCD more aggravating than anything and doesn't keep him from doing things. Has  to check and wash hands and makes his wife a little anxious. Started job [employer] 1 mo ago and going well.   Not depressed.   Plan: Start 1/2 sertraline and reduce fluvoxamine to 2 daily for 5 days, then Increase sertraline to 1 daily and reduce fluvoxamine to 1 and 1/2 tablets daily for 5 days, then Increase sertraline to 1 and 1/2 tablets daily and reduce fluvoxamine to 1 daily for 5 days, then Increase sertraline to 2 daily and reduce fluvoxamine to 1/2 tablet for 5 days, Then stop fluvoxamine.  09/14/20 appt with following noted: Probably better.  No sig SE. OCD lessened and handles it better. No sessions with Mark House right now bc seemed to learn what Mark House needed.  I need to embrace it right now.  Contamination obsessions are the meain problem.  Anxiety low moderate.  Don't get bogged down as much on showering and handwashing. 100% truthfulness obsessions can be a problem. Slight tremor ? New or worse.  Not problematic. Plan: Increase Sertraline to 2 and 1/2 tablets daily for 2 weeks, then increase to 2 in the AM and 1 in the PM. Continue aripiprazole 2 mg daily.  02/07/2021 appointment with following noted: This appointment was delayed because it had to be rescheduled.  continued meds from above. Not too bad.  Pleased with meds.  Able to resist OCD better though it's not gone. Stopped therapy bc maxed benefit. No SE noted.    Mild covid in January without problems. Still mild trembling in hands.  1 cup coffee each AM.    Work [employer] and it helps.  Plan: DC Abilify to see if tremor better  Mark House may not need Abilify DT benefit with sertraline.  05/18/21 appt with following noted: Mark House missed appt in May.  Forgot it.   Doing the best I've done since before the Covid. OCD is really better.  Can resist the urges.  More easily practices the ERP.    Was on Luvox for 20+ years until retirement and Covid.  Doing well now and pleased with sertraline.  Working [employer] at [employer]. Disc tremor which  is not worse but maybe better. Took 1 propranolol tablet and within a couple of hours saw black specks lasted 5 seconds and resolved.  Wasn't dizzy.  Saw commercials about TD and wonders about it.  No one notices anything except wife under rare circumstances.  No SE sertraline.  Mark House reports stable mood and denies depressed or irritable moods.   Mark House denies difficulty with sleep initiation or maintenance 9 hours.. Denies appetite disturbance.  Mark House reports that energy and motivation have been good.  Mark House denies any difficulty with concentration.  [facility] denies any suicidal ideation.  Past Psychiatric Medication Trials: Anafranil remote and brief Buspirone with Luvox ? Rash Fluvoxamine 300  Sertraline 300 Abilify 10 SE, 2 mg helped without SE.  Review of Systems:  Review of Systems  Constitutional: Negative for fatigue.  HENT: Negative for postnasal drip.   Cardiovascular: Negative for chest pain and palpitations.  Gastrointestinal: Negative for diarrhea.  Neurological: Positive for tremors. Negative for weakness.    Medications: I have reviewed the Mark House's current medications.  Current Outpatient Medications  Medication Sig Dispense Refill  . clonazePAM (KLONOPIN) 0.5 MG tablet Take 1 tablet (0.5 mg total) by mouth 2 (two) times daily. Usually takes daily (Mark House taking differently: Take 0.5 mg by mouth 2 (two) times daily. PRN) 60 tablet 2  . Multiple Vitamin (MULTIVITAMIN) tablet Take 1 tablet by mouth daily.    . propranolol (INDERAL) 20 MG tablet 1-2 tablets twice daily as needed for tremor (Mark House not taking: Reported on 05/18/2021) 60 tablet 1  . sertraline (ZOLOFT) 100 MG tablet TAKE 2 TABLETS BY MOUTH IN THE MORNING AND 1 TABLET BY MOUTH IN THE EVENING 270 tablet 1   No current facility-administered medications for this visit.    Medication Side Effects:  increased saliva and rhinorrhea, listless  Allergies: No Known Allergies  Past Medical History:   Diagnosis Date  . OCD (obsessive compulsive disorder)   . Pericarditis     Family History  Problem Relation Age of Onset  . Cancer - Lung Mother   . Hypertension Mother   . Stroke Mother   . Heart disease Father   . Kidney disease Father   . Cancer Maternal Grandmother     Social History   Socioeconomic History  . Marital status: Married    Spouse name: Not on file  . Number of children: 0  . Years of education: Not on file  . Highest education level: Not on file  Occupational History  . Occupation: Retired  Tobacco Use  . Smoking status: Never Smoker  . Smokeless tobacco: Former Clinical biochemistUser  Vaping Use  . Vaping Use: Never used  Substance and Sexual Activity  . Alcohol  use: Yes    Alcohol/week: 21.0 standard drinks    Types: 21 Cans of beer per week  . Drug use: Not Currently  . Sexual activity: Yes    Partners: Female    Birth control/protection: Post-menopausal  Other Topics Concern  . Not on file  Social History Narrative   Retired   Right handed   Lives at home with wife in a two story home with dog and cat.   Caffeine - I cup coffee in am   Social Determinants of Health   Financial Resource Strain: Not on file  Food Insecurity: Not on file  Transportation Needs: Not on file  Physical Activity: Not on file  Stress: Not on file  Social Connections: Not on file  Intimate Partner Violence: Not on file    Past Medical History, Surgical history, Social history, and Family history were reviewed and updated as appropriate.   Please see review of systems for further details on the Mark House's review from today.   Objective:   Physical Exam:  There were no vitals taken for this visit.  Physical Exam Constitutional:      General: Mark House is not in acute distress. Musculoskeletal:        General: No deformity.  Neurological:     Mental Status: Mark House is alert and oriented to person, place, and time.     Coordination: Coordination normal.  Psychiatric:         Attention and Perception: Attention and perception normal. Mark House does not perceive auditory or visual hallucinations.        Mood and Affect: Mood is anxious. Mood is not depressed. Affect is not labile, blunt, angry or inappropriate.        Speech: Speech normal.        Behavior: Behavior normal.        Thought Content: Thought content normal. Thought content is not paranoid or delusional. Thought content does not include homicidal or suicidal ideation. Thought content does not include homicidal or suicidal plan.        Cognition and Memory: Cognition and memory normal.        Judgment: Judgment normal.     Comments: Insight intact Residual OCD, contamination, handwashing showering excessively better by a great deal Much Less obsessions. Mild tremor. No evidence of TD with AIM exam.     Lab Review:     Component Value Date/Time   NA 140 05/26/2019 1040   K 4.1 05/26/2019 1040   CL 103 05/26/2019 1040   CO2 25 05/26/2019 1040   GLUCOSE 93 05/26/2019 1040   BUN 19 05/26/2019 1040   CREATININE 1.33 05/26/2019 1040   CALCIUM 10.0 05/26/2019 1040       Component Value Date/Time   WBC 5.5 05/26/2019 1040   RBC 4.90 05/26/2019 1040   HGB 16.9 05/26/2019 1040   HCT 46.7 05/26/2019 1040   PLT 144 05/26/2019 1040   MCV 95.3 05/26/2019 1040   MCH 34.5 (H) 05/26/2019 1040   MCHC 36.2 (H) 05/26/2019 1040   RDW 12.5 05/26/2019 1040   LYMPHSABS 710 (L) 05/26/2019 1040   EOSABS 50 05/26/2019 1040   BASOSABS 28 05/26/2019 1040    No results found for: POCLITH, LITHIUM   No results found for: PHENYTOIN, PHENOBARB, VALPROATE, CBMZ   .res Assessment: Plan:    [facility] House was seen today for follow-up, mixed obsessional thoughts and acts and medication problem.  Diagnoses and all orders for this visit:  Mixed obsessional thoughts and acts -  sertraline (ZOLOFT) 100 MG tablet; TAKE 2 TABLETS BY MOUTH IN THE MORNING AND 1 TABLET BY MOUTH IN THE EVENING  Tremor of both outstretched  hands  Good response with switch from fluvoxamine to sertraline.  No problems off Abilify.  Discussed the usual treatment plan as well as the duration required to get optimal results. Rarely 100% free from OCD but Mark House'd like to get it back to the way it was several years ago with minimal disruption.  This time last year OCD was debilitating. Disc the treatment prognosis and typical outcomes with treatment.  Cont sertraline.  Disc studies about higher than usual dosages of 200 mg daily.  Disc option increasing.  Mark House's tolerating it well.  Disc serotonergic SE in detail.  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 continue Sertraline 2 in the AM and 1 in the PM..  Discussed potential metabolic side effects associated with atypical antipsychotics, as well as potential risk for movement side effects. Advised Mark House to contact office if movement side effects occur.    Consider propranolol for tremor .  Looks like essential tremor and not EPS.  Disc dx and options.  Option retry  We discussed the short-term risks associated with benzodiazepines including sedation and increased fall risk among others.  Discussed long-term side effect risk including dependence, potential withdrawal symptoms, and the potential eventual dose-related risk of dementia.  But recent studies from 2020 dispute this association between benzodiazepines and dementia risk.  Newwer studies refute risk.  Also took prn clonazepam for anxiety.  It was somewhat helpful and Mark House wants to continue it prn  Discussed the advantage of cognitive behavior therapy and is especially exposure response prevention therapy for treatment of OCD. Questions about the types of therapy for OCD.  Mild scrupulosity sx.    Follow-up 16 weeks   Meredith Staggers MD, DFAPA  Please see After Visit Summary for [facility] specific instructions.  No future appointments.  No orders of the defined types were placed in this  encounter.   -------------------------------

## 2021-09-19 ENCOUNTER — Ambulatory Visit (INDEPENDENT_AMBULATORY_CARE_PROVIDER_SITE_OTHER): Payer: 59 | Admitting: Psychiatry

## 2021-09-19 ENCOUNTER — Other Ambulatory Visit: Payer: Self-pay

## 2021-09-19 ENCOUNTER — Encounter: Payer: Self-pay | Admitting: Psychiatry

## 2021-09-19 DIAGNOSIS — F422 Mixed obsessional thoughts and acts: Secondary | ICD-10-CM | POA: Diagnosis not present

## 2021-09-19 DIAGNOSIS — R251 Tremor, unspecified: Secondary | ICD-10-CM

## 2021-09-19 MED ORDER — SERTRALINE HCL 100 MG PO TABS: ORAL_TABLET | ORAL | 1 refills | Status: DC

## 2021-09-19 NOTE — Progress Notes (Signed)
Mark House [id number] December 14, 1960 60 y.o.  Subjective:   Patient ID:  Mark House is a 60 y.o. (DOB 03-26-1961) male.  Chief Complaint:  Chief Complaint  Patient presents with   Follow-up   Mixed obsessional thoughts and acts    HPI Mark House presents to the office today for follow-up of OCD was referred by therapist Buena Irish.  First seen December 18, 2019.  He was on fluvoxamine 300 mg daily.  Abilify 5 mg was added to potentiate the antidepressant to treat OCD.  Worse over the last year.  seen February 26, 2020.  The following was noted:  Switched Abilify to HS helped with SE now none at this time.  Definietly taken an edge off the OCD.  Till experiencing OCD but it's better by about 50% better.  Would like further benefit and not at good as in the past.  Helped quickly.To further improve the OCD Abilify was increased to 10 mg daily.  He also continued fluvoxamine 300 mg daily and clonazepam for anxiety and OCD.  April 08, 2020 appointment the following is noted: Abilify 10 after 2 weeks it was too much so cut back to 5 mg daily. ? Depression with lack of ambition.  Tremor.   OCD is better with Abilify and comparable to last time.  OCD 15% better with Abilify fairly quickly.  Typically OCD time spent includes 20 min shower or handwash 3-4 times.  Contamination is focus of OCD.  Less distressed from thoughts but on his mind a good deal of the time. Plan: Too fatigued with Abilify 10.  Stop it DT SE.  If sx get worse then restart at 2 mg daily.    June 10, 2020 appointment with the following noted: Stopped Abilify and the fatigue improved.  After a couple of weeks felt worse with the OCD so restarted Abilify 2 mg and it helped take the edge off. No SE with it.  OCD remains but Abilify helps. Continues fluvoxamine 300 mg daily. Klonopin about 0.5 mg daily prn. OCD more aggravating than anything and doesn't keep him from doing things. Has to check and wash hands and  makes his wife a little anxious. 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   Not depressed.   Plan: Start 1/2 sertraline and reduce fluvoxamine to 2 daily for 5 days, then Increase sertraline to 1 daily and reduce fluvoxamine to 1 and 1/2 tablets daily for 5 days, then Increase sertraline to 1 and 1/2 tablets daily and reduce fluvoxamine to 1 daily for 5 days, then Increase sertraline to 2 daily and reduce fluvoxamine to 1/2 tablet for 5 days, Then stop fluvoxamine.  09/14/20 appt with following noted: Probably better.  No sig SE. OCD lessened and handles it better. No sessions with Nadine Counts right now bc seemed to learn what he needed.  I need to embrace it right now.  Contamination obsessions are the meain problem.  Anxiety low moderate.  Don't get bogged down as much on showering and handwashing. 100% truthfulness obsessions can be a problem. Slight tremor ? New or worse.  Not problematic. Plan: Increase Sertraline to 2 and 1/2 tablets daily for 2 weeks, then increase to 2 in the AM and 1 in the PM. Continue aripiprazole 2 mg daily.  02/07/2021 appointment with following noted: This appointment was delayed because it had to be rescheduled.  continued meds from above. Not too bad.  Pleased with meds.  Able to resist OCD better  though it's not gone. Stopped therapy bc maxed benefit. No SE noted.    Mild covid in January without problems. Still mild trembling in hands.  1 cup coffee each AM.    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  Plan: DC Abilify to see if tremor better  He may not need Abilify DT benefit with sertraline.  05/18/21 appt with following noted: He missed appt in May.  Forgot it.   Doing the best I've done since before the Covid. OCD is really better.  Can resist the urges.  More easily practices the ERP.    Was on Luvox for 20+ years until retirement and Covid.  Doing well now and pleased with sertraline.  Working PT at funeral home. Disc tremor which is not worse but maybe  better. Took 1 propranolol tablet and within a couple of hours saw black specks lasted 5 seconds and resolved.  Wasn't dizzy.  Saw commercials about TD and wonders about it.  No one notices anything except wife under rare circumstances.  No SE sertraline. Plan: continue sertraline 300.  He stopped Abilify  09/19/21 appt noted: Takes Klonopin rarely.  Consistent with sertraline.  No tremor med desired with better tremor.  No problems with it.  Not as anxious as in the past and OCD managed. No other SE sertraline. 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  Not severe.   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  Patient reports stable mood and denies depressed or irritable moods.   Patient denies difficulty with sleep initiation or maintenance 9 hours.. Denies appetite disturbance.  Patient reports that energy and motivation have been good.  Patient denies any difficulty with concentration.  Patient denies any suicidal ideation.  Past Psychiatric Medication Trials: Anafranil remote and brief Buspirone with Luvox ? Rash Fluvoxamine 300  Sertraline 300 Abilify 10 SE, 2 mg helped without SE.  Review of Systems:  Review of Systems  Constitutional:  Negative for fatigue.  HENT:  Negative for postnasal drip.   Cardiovascular:  Negative for chest pain and palpitations.  Gastrointestinal:  Negative for diarrhea.  Genitourinary:  Positive for urgency.  Neurological:  Negative for tremors and weakness.   Medications: I have reviewed the patient's current medications.  Current Outpatient Medications  Medication Sig Dispense Refill   clonazePAM (KLONOPIN) 0.5 MG tablet Take 1 tablet (0.5 mg total) by mouth 2 (two) times daily. Usually takes daily (Patient taking differently: Take 0.5 mg by mouth 2 (two) times daily. PRN) 60 tablet 2   Multiple Vitamin (MULTIVITAMIN) tablet Take 1 tablet by mouth daily.     Omega 3 1200 MG CAPS Take by mouth.     sertraline (ZOLOFT) 100 MG  tablet TAKE 2 TABLETS BY MOUTH IN THE MORNING AND 1 TABLET BY MOUTH IN THE EVENING 270 tablet 1   No current facility-administered medications for this visit.    Medication Side Effects:  increased saliva and rhinorrhea, listless  Allergies: No Known Allergies  Past Medical History:  Diagnosis Date   OCD (obsessive compulsive disorder)    Pericarditis     Family History  Problem Relation Age of Onset   Cancer - Lung Mother    Hypertension Mother    Stroke Mother    Heart disease Father    Kidney disease Father    Cancer Maternal Grandmother     Social History   Socioeconomic History   Marital status: Married    Spouse name: Not  on file   Number of children: 0   Years of education: Not on file   Highest education level: Not on file  Occupational History   Occupation: Retired  Tobacco Use   Smoking status: Never   Smokeless tobacco: Former    Quit date: 2018  Vaping Use   Vaping Use: Never used  Substance and Sexual Activity   Alcohol use: Yes    Alcohol/week: 21.0 standard drinks    Types: 21 Cans of beer per week   Drug use: Not Currently   Sexual activity: Yes    Partners: Female    Birth control/protection: Post-menopausal  Other Topics Concern   Not on file  Social History Narrative   Retired   Right handed   Lives at home with wife in a two story home with dog and cat.   Caffeine - I cup coffee in am   Social Determinants of Health   Financial Resource Strain: Not on file  Food Insecurity: Not on file  Transportation Needs: Not on file  Physical Activity: Not on file  Stress: Not on file  Social Connections: Not on file  Intimate Partner Violence: Not on file    Past Medical History, Surgical history, Social history, and Family history were reviewed and updated as appropriate.   Please see review of systems for further details on the patient's review from today.   Objective:   Physical Exam:  There were no vitals taken for this  visit.  Physical Exam Constitutional:      General: He is not in acute distress. Musculoskeletal:        General: No deformity.  Neurological:     Mental Status: He is alert and oriented to person, place, and time.     Coordination: Coordination normal.  Psychiatric:        Attention and Perception: Attention and perception normal. He does not perceive auditory or visual hallucinations.        Mood and Affect: Mood is anxious. Mood is not depressed. Affect is not labile, blunt, angry or inappropriate.        Speech: Speech normal.        Behavior: Behavior normal.        Thought Content: Thought content normal. Thought content is not paranoid or delusional. Thought content does not include homicidal or suicidal ideation. Thought content does not include homicidal or suicidal plan.        Cognition and Memory: Cognition and memory normal.        Judgment: Judgment normal.     Comments: Insight intact Residual OCD, contamination, handwashing showering excessively better by a great deal Much Less obsessions. Mild tremor. No evidence of TD with AIM exam.    Lab Review:     Component Value Date/Time   NA 140 05/26/2019 1040   K 4.1 05/26/2019 1040   CL 103 05/26/2019 1040   CO2 25 05/26/2019 1040   GLUCOSE 93 05/26/2019 1040   BUN 19 05/26/2019 1040   CREATININE 1.33 05/26/2019 1040   CALCIUM 10.0 05/26/2019 1040       Component Value Date/Time   WBC 5.5 05/26/2019 1040   RBC 4.90 05/26/2019 1040   HGB 16.9 05/26/2019 1040   HCT 46.7 05/26/2019 1040   PLT 144 05/26/2019 1040   MCV 95.3 05/26/2019 1040   MCH 34.5 (H) 05/26/2019 1040   MCHC 36.2 (H) 05/26/2019 1040   RDW 12.5 05/26/2019 1040   LYMPHSABS 710 (L) 05/26/2019 1040  EOSABS 50 05/26/2019 1040   BASOSABS 28 05/26/2019 1040    No results found for: POCLITH, LITHIUM   No results found for: PHENYTOIN, PHENOBARB, VALPROATE, CBMZ   .res Assessment: Plan:    Mark House was seen today for follow-up and mixed  obsessional thoughts and acts.  Diagnoses and all orders for this visit:  Mixed obsessional thoughts and acts -     sertraline (ZOLOFT) 100 MG tablet; TAKE 2 TABLETS BY MOUTH IN THE MORNING AND 1 TABLET BY MOUTH IN THE EVENING  Tremor of both outstretched hands Good response with switch from fluvoxamine to sertraline 06/2020.  Stopped Abilify.  Discussed the usual treatment plan as well as the duration required to get optimal results. Rarely 100% free from OCD but he'd like to get it back to the way it was several years ago with minimal disruption.  Early 2021 OCD was debilitating. Disc the treatment prognosis and typical outcomes with treatment.  Cont sertraline.  Disc studies about higher than usual dosages of 200 mg daily.  Disc option increasing.  He's tolerating it well.  Disc serotonergic SE in detail.  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 continue Sertraline 2 in the AM and 1 in the PM..  We discussed the short-term risks associated with benzodiazepines including sedation and increased fall risk among others.  Discussed long-term side effect risk including dependence, potential withdrawal symptoms, and the potential eventual dose-related risk of dementia.  But recent studies from 2020 dispute this association between benzodiazepines and dementia risk.  Newwer studies refute risk.  Also took prn clonazepam for anxiety.  It was somewhat helpful and he wants to continue it prn  Discussed the advantage of cognitive behavior therapy and is especially exposure response prevention therapy for treatment of OCD.  He uses ERP with greater success. Questions about the types of therapy for OCD.  Mild scrupulosity sx.    Answered questions about occ Benadryl for sleep.  Follow-up 6 mos   Meredith Staggers MD, DFAPA  Please see After Visit Summary for patient specific instructions.  No future appointments.  No orders of the defined types were  placed in this encounter.   -------------------------------

## 2021-11-23 ENCOUNTER — Other Ambulatory Visit: Payer: Self-pay | Admitting: Psychiatry

## 2021-11-23 DIAGNOSIS — F422 Mixed obsessional thoughts and acts: Secondary | ICD-10-CM

## 2021-12-31 ENCOUNTER — Other Ambulatory Visit: Payer: Self-pay | Admitting: Psychiatry

## 2021-12-31 DIAGNOSIS — F422 Mixed obsessional thoughts and acts: Secondary | ICD-10-CM

## 2022-03-20 ENCOUNTER — Encounter: Payer: Self-pay | Admitting: Psychiatry

## 2022-03-20 ENCOUNTER — Ambulatory Visit: Payer: 59 | Admitting: Psychiatry

## 2022-03-20 DIAGNOSIS — R251 Tremor, unspecified: Secondary | ICD-10-CM

## 2022-03-20 DIAGNOSIS — F422 Mixed obsessional thoughts and acts: Secondary | ICD-10-CM | POA: Diagnosis not present

## 2022-03-20 MED ORDER — SERTRALINE HCL 100 MG PO TABS: ORAL_TABLET | ORAL | 2 refills | Status: DC

## 2022-03-20 NOTE — Progress Notes (Signed)
Mark House ?[id number] ?06/28/1961 ?61 y.o. ? ?Subjective:  ? ?Patient ID:  Mark House is a 61 y.o. (DOB [birth year]) male. ? ?Chief Complaint:  ?Chief Complaint  ?Patient presents with  ? Follow-up  ? Anxiety  ? ? ?HPI ?Mark House presents to the office today for follow-up of OCD was referred by therapist Buena Irish. ? ?First seen December 18, 2019.  He was on fluvoxamine 300 mg daily.  Abilify 5 mg was added to potentiate the antidepressant to treat OCD.  Worse over the last year. ? ?seen February 26, 2020.  The following was noted:  Switched Abilify to HS helped with SE now none at this time.  Definietly taken an edge off the OCD.  Till experiencing OCD but it's better by about 50% better.  Would like further benefit and not at good as in the past.  Helped quickly.To further improve the OCD Abilify was increased to 10 mg daily.  He also continued fluvoxamine 300 mg daily and clonazepam for anxiety and OCD. ? ?April 08, 2020 appointment the following is noted: ?Abilify 10 after 2 weeks it was too much so cut back to 5 mg daily. ?? Depression with lack of ambition.  Tremor.   ?OCD is better with Abilify and comparable to last time.  OCD 15% better with Abilify fairly quickly.  ?Typically OCD time spent includes 20 min shower or handwash 3-4 times.  Contamination is focus of OCD.  Less distressed from thoughts but on his mind a good deal of the time. ?Plan: Too fatigued with Abilify 10.  Stop it DT SE.  If sx get worse then restart at 2 mg daily.   ? ?June 10, 2020 appointment with the following noted: ?Stopped Abilify and the fatigue improved.  After a couple of weeks felt worse with the OCD so restarted Abilify 2 mg and it helped take the edge off. No SE with it.  OCD remains but Abilify helps. ?Continues fluvoxamine 300 mg daily. ?Klonopin about 0.5 mg daily prn. ?OCD more aggravating than anything and doesn't keep him from doing things. ?Has to check and wash hands and makes his wife a little  anxious. ?Started job PT 1 mo ago and going well.   ?Not depressed.   ?Plan: Start 1/2 sertraline and reduce fluvoxamine to 2 daily for 5 days, then ?Increase sertraline to 1 daily and reduce fluvoxamine to 1 and 1/2 tablets daily for 5 days, then ?Increase sertraline to 1 and 1/2 tablets daily and reduce fluvoxamine to 1 daily for 5 days, then ?Increase sertraline to 2 daily and reduce fluvoxamine to 1/2 tablet for 5 days, ?Then stop fluvoxamine. ? ?09/14/20 appt with following noted: ?Probably better.  No sig SE. ?OCD lessened and handles it better. ?No sessions with Nadine Counts right now bc seemed to learn what he needed.  I need to embrace it right now.  Contamination obsessions are the meain problem.  Anxiety low moderate.  Don't get bogged down as much on showering and handwashing. ?100% truthfulness obsessions can be a problem. ?Slight tremor ? New or worse.  Not problematic. ?Plan: Increase Sertraline to 2 and 1/2 tablets daily for 2 weeks, then increase to 2 in the AM and 1 in the PM. ?Continue aripiprazole 2 mg daily. ? ?02/07/2021 appointment with following noted: ?This appointment was delayed because it had to be rescheduled. ? continued meds from above. ?Not too bad.  Pleased with meds.  Able to resist OCD better though it's not gone. ?  Stopped therapy bc maxed benefit. ?No SE noted.    ?Mild covid in January without problems. ?Still mild trembling in hands.  ?1 cup coffee each AM.    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  ?Plan: DC Abilify to see if tremor better ? He may not need Abilify DT benefit with sertraline. ? ?05/18/21 appt with following noted: ?He missed appt in May.  Forgot it.   ?Doing the best I've done since before the Covid. ?OCD is really better.  Can resist the urges.  More easily practices the ERP.    ?Was on Luvox for 20+ years until retirement and Covid.  Doing well now and pleased with sertraline.  Working PT at funeral home. ?Disc tremor which is not worse but maybe better. ?Took 1 propranolol  tablet and within a couple of hours saw black specks lasted 5 seconds and resolved.  Wasn't dizzy.  ?Saw commercials about TD and wonders about it.  No one notices anything except wife under rare circumstances.  ?No SE sertraline. ?Plan: continue sertraline 300.  He stopped Abilify ? ?09/19/21 appt noted: ?Takes Klonopin rarely.  Consistent with sertraline.  No tremor med desired with better tremor.  No problems with it.  Not as anxious as in the past and OCD managed. ?No other SE sertraline. 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  Not severe.   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 ?Plan: Continue sertraline 300 mg daily ? ?03/20/2022 appointment with the following noted: ?Very rare clonazepam ?Consistent with sertraline 300 mg daily.  No sig SE.   ?Minimal tremor. ?Overall good as I've been in a long time.  Satisfied with meds. ?M 61 yo now in asst living ing and that has helped. ? ?Patient reports stable mood and denies depressed or irritable moods.   Patient denies difficulty with sleep initiation or maintenance 9 hours.. Denies appetite disturbance.  Patient reports that energy and motivation have been good.  Patient denies any difficulty with concentration.  Patient denies any suicidal ideation. ? ?Past Psychiatric Medication Trials: ?Anafranil remote and brief ?Buspirone with Luvox ? Rash ?Fluvoxamine 300  ?Sertraline 300 ?Abilify 10 SE, 2 mg helped without SE. ? ?Review of Systems:  ?Review of Systems  ?Constitutional:  Negative for fatigue.  ?HENT:  Negative for postnasal drip.   ?Cardiovascular:  Negative for palpitations.  ?Gastrointestinal:  Negative for diarrhea.  ?Genitourinary:  Positive for urgency.  ?Neurological:  Negative for tremors.  ? ?Medications: I have reviewed the patient's current medications. ? ?Current Outpatient Medications  ?Medication Sig Dispense Refill  ? clonazePAM (KLONOPIN) 0.5 MG tablet Take 1 tablet (0.5 mg total) by mouth 2 (two) times daily.  Usually takes daily (Patient taking differently: Take 0.5 mg by mouth 2 (two) times daily. PRN) 60 tablet 2  ? Multiple Vitamin (MULTIVITAMIN) tablet Take 1 tablet by mouth daily.    ? Omega 3 1200 MG CAPS Take by mouth.    ? sertraline (ZOLOFT) 100 MG tablet TAKE 2 TABLETS BY MOUTH IN THE MORNING AND 1 TABLET IN THE EVENING 270 tablet 2  ? ?No current facility-administered medications for this visit.  ? ? ?Medication Side Effects:  increased saliva and rhinorrhea, listless ? ?Allergies: No Known Allergies ? ?Past Medical History:  ?Diagnosis Date  ? OCD (obsessive compulsive disorder)   ? Pericarditis   ? ? ?Family History  ?Problem Relation Age of Onset  ? Cancer - Lung Mother   ? Hypertension Mother   ?  Stroke Mother   ? Heart disease Father   ? Kidney disease Father   ? Cancer Maternal Grandmother   ? ? ?Social History  ? ?Socioeconomic History  ? Marital status: Married  ?  Spouse name: Not on file  ? Number of children: 0  ? Years of education: Not on file  ? Highest education level: Not on file  ?Occupational History  ? Occupation: Retired  ?Tobacco Use  ? Smoking status: Never  ? Smokeless tobacco: Former  ?  Quit date: 2018  ?Vaping Use  ? Vaping Use: Never used  ?Substance and Sexual Activity  ? Alcohol use: Yes  ?  Alcohol/week: 21.0 standard drinks  ?  Types: 21 Cans of beer per week  ? Drug use: Not Currently  ? Sexual activity: Yes  ?  Partners: Female  ?  Birth control/protection: Post-menopausal  ?Other Topics Concern  ? Not on file  ?Social History Narrative  ? Retired  ? Right handed  ? Lives at home with wife in a two story home with dog and cat.  ? Caffeine - I cup coffee in am  ? ?Social Determinants of Health  ? ?Financial Resource Strain: Not on file  ?Food Insecurity: Not on file  ?Transportation Needs: Not on file  ?Physical Activity: Not on file  ?Stress: Not on file  ?Social Connections: Not on file  ?Intimate Partner Violence: Not on file  ? ? ?Past Medical History, Surgical history,  Social history, and Family history were reviewed and updated as appropriate.  ? ?Please see review of systems for further details on the patient's review from today.  ? ?Objective:  ? ?Physical Exam:  ?There were no v

## 2022-04-24 ENCOUNTER — Ambulatory Visit (INDEPENDENT_AMBULATORY_CARE_PROVIDER_SITE_OTHER): Payer: 59

## 2022-04-24 ENCOUNTER — Ambulatory Visit: Payer: 59 | Admitting: Podiatry

## 2022-04-24 ENCOUNTER — Encounter: Payer: Self-pay | Admitting: Podiatry

## 2022-04-24 DIAGNOSIS — M7751 Other enthesopathy of right foot: Secondary | ICD-10-CM | POA: Diagnosis not present

## 2022-04-24 DIAGNOSIS — M779 Enthesopathy, unspecified: Secondary | ICD-10-CM

## 2022-04-24 DIAGNOSIS — M7752 Other enthesopathy of left foot: Secondary | ICD-10-CM

## 2022-04-24 DIAGNOSIS — M21622 Bunionette of left foot: Secondary | ICD-10-CM

## 2022-04-24 DIAGNOSIS — M21621 Bunionette of right foot: Secondary | ICD-10-CM

## 2022-04-24 MED ORDER — TRIAMCINOLONE ACETONIDE 10 MG/ML IJ SUSP
20.0000 mg | Freq: Once | INTRAMUSCULAR | Status: AC
  Administered 2022-04-24: 20 mg

## 2022-04-24 NOTE — Progress Notes (Signed)
Subjective:  ? ?Patient ID: House House, male   DOB: 61 y.o.   MRN: [id number]  ? ?HPI ?Patient presents stating he is developed a lot of pain on the bottom of both feet and states that he is not sure what happened but he thinks maybe they are warts.  They are exactly the same spot both feet ? ? ?ROS ? ? ?   ?Objective:  ?Physical Exam  ?Neurovascular status intact with keratotic lesion underneath the fifth metatarsal head bilateral with inflammation with fluid accumulation underneath the heads and moderate prominence of the metatarsal head itself.  Patient has good digital perfusion well oriented ? ?   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 ?   ?Plan:  ?H&P x-rays reviewed education rendered and today I did do sterile prep and I injected the fifth MPJ 3 mg dexamethasone Kenalog 5 mg Xylocaine and I did deep debridement of lesions advised on padding and discussed possibly working on the lesions themselves or possibility for met head resection at 1 point future ? ?X-rays do indicate the lesions are directly underneath the fifth metatarsal head of both feet ?   ? ? ?

## 2022-07-26 ENCOUNTER — Other Ambulatory Visit: Payer: Self-pay | Admitting: Gastroenterology

## 2022-07-26 DIAGNOSIS — R748 Abnormal levels of other serum enzymes: Secondary | ICD-10-CM

## 2022-07-28 ENCOUNTER — Ambulatory Visit
Admission: RE | Admit: 2022-07-28 | Discharge: 2022-07-28 | Disposition: A | Discharge status: Home / Self Care | Payer: 59 | Source: Ambulatory Visit | Attending: Gastroenterology | Admitting: Gastroenterology

## 2022-07-28 DIAGNOSIS — R748 Abnormal levels of other serum enzymes: Secondary | ICD-10-CM

## 2022-12-19 ENCOUNTER — Ambulatory Visit: Payer: 59 | Admitting: Psychiatry

## 2022-12-19 ENCOUNTER — Encounter: Payer: Self-pay | Admitting: Psychiatry

## 2022-12-19 DIAGNOSIS — F422 Mixed obsessional thoughts and acts: Secondary | ICD-10-CM | POA: Diagnosis not present

## 2022-12-19 DIAGNOSIS — R251 Tremor, unspecified: Secondary | ICD-10-CM

## 2022-12-19 MED ORDER — SERTRALINE HCL 100 MG PO TABS: ORAL_TABLET | ORAL | 3 refills | Status: DC

## 2022-12-19 NOTE — Progress Notes (Signed)
Mark House [id number] Jul 02, 1961 62 y.o.  Subjective:   Patient ID:  Mark House is a 62 y.o. (DOB October 21, 1961) male.  Chief Complaint:  Chief Complaint  Patient presents with   Follow-up    Mixed obsessional thoughts and acts    HPI Mark House presents to the office today for follow-up of OCD was referred by therapist Marchelle Folks.  First seen December 18, 2019.  He was on fluvoxamine 300 mg daily.  Abilify 5 mg was added to potentiate the antidepressant to treat OCD.  Worse over the last year.  seen February 26, 2020.  The following was noted:  Switched Abilify to HS helped with SE now none at this time.  Definietly taken an edge off the OCD.  Till experiencing OCD but it's better by about 50% better.  Would like further benefit and not at good as in the past.  Helped quickly.To further improve the OCD Abilify was increased to 10 mg daily.  He also continued fluvoxamine 300 mg daily and clonazepam for anxiety and OCD.  April 08, 2020 appointment the following is noted: Abilify 10 after 2 weeks it was too much so cut back to 5 mg daily. ? Depression with lack of ambition.  Tremor.   OCD is better with Abilify and comparable to last time.  OCD 15% better with Abilify fairly quickly.  Typically OCD time spent includes 20 min shower or handwash 3-4 times.  Contamination is focus of OCD.  Less distressed from thoughts but on his mind a good deal of the time. Plan: Too fatigued with Abilify 10.  Stop it DT SE.  If sx get worse then restart at 2 mg daily.    June 10, 2020 appointment with the following noted: Stopped Abilify and the fatigue improved.  After a couple of weeks felt worse with the OCD so restarted Abilify 2 mg and it helped take the edge off. No SE with it.  OCD remains but Abilify helps. Continues fluvoxamine 300 mg daily. Klonopin about 0.5 mg daily prn. OCD more aggravating than anything and doesn't keep him from doing things. Has to check and wash hands and  makes his wife a little anxious. 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   Not depressed.   Plan: Start 1/2 sertraline and reduce fluvoxamine to 2 daily for 5 days, then Increase sertraline to 1 daily and reduce fluvoxamine to 1 and 1/2 tablets daily for 5 days, then Increase sertraline to 1 and 1/2 tablets daily and reduce fluvoxamine to 1 daily for 5 days, then Increase sertraline to 2 daily and reduce fluvoxamine to 1/2 tablet for 5 days, Then stop fluvoxamine.  09/14/20 appt with following noted: Probably better.  No sig SE. OCD lessened and handles it better. No sessions with Mark House right now bc seemed to learn what he needed.  I need to embrace it right now.  Contamination obsessions are the meain problem.  Anxiety low moderate.  Don't get bogged down as much on showering and handwashing. 100% truthfulness obsessions can be a problem. Slight tremor ? New or worse.  Not problematic. Plan: Increase Sertraline to 2 and 1/2 tablets daily for 2 weeks, then increase to 2 in the AM and 1 in the PM. Continue aripiprazole 2 mg daily.  02/07/2021 appointment with following noted: This appointment was delayed because it had to be rescheduled.  continued meds from above. Not too bad.  Pleased with meds.  Able to resist OCD  better though it's not gone. Stopped therapy bc maxed benefit. No SE noted.    Mild covid in January without problems. Still mild trembling in hands.  1 cup coffee each AM.    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  Plan: DC Abilify to see if tremor better  He may not need Abilify DT benefit with sertraline.  05/18/21 appt with following noted: He missed appt in May.  Forgot it.   Doing the best I've done since before the Covid. OCD is really better.  Can resist the urges.  More easily practices the ERP.    Was on Luvox for 20+ years until retirement and Covid.  Doing well now and pleased with sertraline.  Working PT at funeral home. Disc tremor which is not worse but maybe  better. Took 1 propranolol tablet and within a couple of hours saw black specks lasted 5 seconds and resolved.  Wasn't dizzy.  Saw commercials about TD and wonders about it.  No one notices anything except wife under rare circumstances.  No SE sertraline. Plan: continue sertraline 300.  He stopped Abilify  09/19/21 appt noted: Takes Klonopin rarely.  Consistent with sertraline.  No tremor med desired with better tremor.  No problems with it.  Not as anxious as in the past and OCD managed. No other SE sertraline. 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  Not severe.   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 Plan: Continue sertraline 300 mg daily  03/20/2022 appointment with the following noted: Very rare clonazepam Consistent with sertraline 300 mg daily.  No sig SE.   Minimal tremor. Overall good as I've been in a long time.  Satisfied with meds. M 62 yo now in asst living ing and that has helped.  12/19/2022 appointment noted: OCD pretty good and overall pretty good. Largely unchanged with slight variation from time to time. Practices ERP he learded from therapist. Consistent with sertraline 300 mg daily.  No sig SE.   No sig clonazepam. No other meds. No excess alcohol.    Patient reports stable mood and denies depressed or irritable moods.   Patient denies difficulty with sleep initiation or maintenance .  Sometimes up too late at night.. Denies appetite disturbance.  Patient reports that energy and motivation have been good.  Patient denies any difficulty with concentration.  Patient denies any suicidal ideation.  Past Psychiatric Medication Trials: Anafranil remote and brief Buspirone with Luvox ? Rash Fluvoxamine 300  Sertraline 300 Abilify 10 SE, 2 mg helped without SE.  Review of Systems:  Review of Systems  Constitutional:  Negative for fatigue.  HENT:  Negative for postnasal drip.   Cardiovascular:  Negative for palpitations.  Gastrointestinal:   Negative for diarrhea.  Genitourinary:  Positive for urgency.    Medications: I have reviewed the patient's current medications.  Current Outpatient Medications  Medication Sig Dispense Refill   clonazePAM (KLONOPIN) 0.5 MG tablet Take 1 tablet (0.5 mg total) by mouth 2 (two) times daily. Usually takes daily (Patient taking differently: Take 0.5 mg by mouth 2 (two) times daily. PRN) 60 tablet 2   Multiple Vitamin (MULTIVITAMIN) tablet Take 1 tablet by mouth daily.     Omega 3 1200 MG CAPS Take by mouth.     sertraline (ZOLOFT) 100 MG tablet TAKE 2 TABLETS BY MOUTH IN THE MORNING AND 1 TABLET IN THE EVENING 270 tablet 3   No current facility-administered medications for this visit.  Medication Side Effects:  increased saliva and rhinorrhea, listless  Allergies: No Known Allergies  Past Medical History:  Diagnosis Date   OCD (obsessive compulsive disorder)    Pericarditis     Family History  Problem Relation Age of Onset   Cancer - Lung Mother    Hypertension Mother    Stroke Mother    Heart disease Father    Kidney disease Father    Cancer Maternal Grandmother     Social History   Socioeconomic History   Marital status: Married    Spouse name: Not on file   Number of children: 0   Years of education: Not on file   Highest education level: Not on file  Occupational History   Occupation: Retired  Tobacco Use   Smoking status: Never   Smokeless tobacco: Former    Quit date: 2018  Vaping Use   Vaping Use: Never used  Substance and Sexual Activity   Alcohol use: Yes    Alcohol/week: 21.0 standard drinks of alcohol    Types: 21 Cans of beer per week   Drug use: Not Currently   Sexual activity: Yes    Partners: Female    Birth control/protection: Post-menopausal  Other Topics Concern   Not on file  Social History Narrative   Retired   Right handed   Lives at home with wife in a two story home with dog and cat.   Caffeine - I cup coffee in am   Social  Determinants of Health   Financial Resource Strain: Not on file  Food Insecurity: Not on file  Transportation Needs: Not on file  Physical Activity: Not on file  Stress: Not on file  Social Connections: Not on file  Intimate Partner Violence: Not on file    Past Medical History, Surgical history, Social history, and Family history were reviewed and updated as appropriate.   Please see review of systems for further details on the patient's review from today.   Objective:   Physical Exam:  There were no vitals taken for this visit.  Physical Exam Constitutional:      General: He is not in acute distress. Musculoskeletal:        General: No deformity.  Neurological:     Mental Status: He is alert and oriented to person, place, and time.     Coordination: Coordination normal.  Psychiatric:        Attention and Perception: Attention and perception normal. He does not perceive auditory or visual hallucinations.        Mood and Affect: Mood is anxious. Mood is not depressed. Affect is not labile, blunt, angry or inappropriate.        Speech: Speech normal.        Behavior: Behavior normal.        Thought Content: Thought content normal. Thought content is not paranoid or delusional. Thought content does not include homicidal or suicidal ideation. Thought content does not include suicidal plan.        Cognition and Memory: Cognition and memory normal.        Judgment: Judgment normal.     Comments: Insight intact Residual OCD, contamination, handwashing showering excessively better by a great deal Much Less obsessions.      Lab Review:     Component Value Date/Time   NA 140 05/26/2019 1040   K 4.1 05/26/2019 1040   CL 103 05/26/2019 1040   CO2 25 05/26/2019 1040   GLUCOSE 93 05/26/2019 1040  BUN 19 05/26/2019 1040   CREATININE 1.33 05/26/2019 1040   CALCIUM 10.0 05/26/2019 1040       Component Value Date/Time   WBC 5.5 05/26/2019 1040   RBC 4.90 05/26/2019 1040    HGB 16.9 05/26/2019 1040   HCT 46.7 05/26/2019 1040   PLT 144 05/26/2019 1040   MCV 95.3 05/26/2019 1040   MCH 34.5 (H) 05/26/2019 1040   MCHC 36.2 (H) 05/26/2019 1040   RDW 12.5 05/26/2019 1040   LYMPHSABS 710 (L) 05/26/2019 1040   EOSABS 50 05/26/2019 1040   BASOSABS 28 05/26/2019 1040    No results found for: "POCLITH", "LITHIUM"   No results found for: "PHENYTOIN", "PHENOBARB", "VALPROATE", "CBMZ"   .res Assessment: Plan:    Mark House was seen today for follow-up.  Diagnoses and all orders for this visit:  Mixed obsessional thoughts and acts -     sertraline (ZOLOFT) 100 MG tablet; TAKE 2 TABLETS BY MOUTH IN THE MORNING AND 1 TABLET IN THE EVENING  Tremor of both outstretched hands  Good response with switch from fluvoxamine to sertraline 06/2020.  Stopped Abilify.  Discussed the usual treatment plan as well as the duration required to get optimal results. Rarely 100% free from OCD but he'd like to get it back to the way it was several years ago with minimal disruption.  Early 2021 OCD was debilitating. Residual OCD, contamination, handwashing showering excessively better by a great deal Much Less obsessions. As good as I've been.   Disc the treatment prognosis and typical outcomes with treatment.  Disc usual goals with OCD treatment and he's achieved a very good partial response.    Cont sertraline.  Disc studies about higher than usual dosages of 200 mg daily.  Disc option increasing.  He's tolerating it well.  Disc serotonergic SE in detail.   He is not willing to take risk of reducing it. continue Sertraline 2 in the AM and 1 in the PM..  We discussed the short-term risks associated with benzodiazepines including sedation and increased fall risk among others.  Discussed long-term side effect risk including dependence, potential withdrawal symptoms, and the potential eventual dose-related risk of dementia.  But recent studies from 2020 dispute this association between  benzodiazepines and dementia risk.  Newwer studies refute risk.  Also took prn clonazepam for anxiety.  It was somewhat helpful and he wants to continue it prn but rarely  Discussed the advantage of cognitive behavior therapy and is especially exposure response prevention therapy for treatment of OCD.  He uses ERP with greater success. Questions about the types of therapy for OCD.  Mild scrupulosity sx.    Disc his borderline TSH result.  Option propranolol for tremor.  Defer.  Follow-up 12 mos   Lynder Parents MD, DFAPA  Please see After Visit Summary for patient specific instructions.  No future appointments.  No orders of the defined types were placed in this encounter.   -------------------------------

## 2022-12-22 ENCOUNTER — Other Ambulatory Visit: Payer: Self-pay | Admitting: Psychiatry

## 2022-12-22 DIAGNOSIS — F422 Mixed obsessional thoughts and acts: Secondary | ICD-10-CM

## 2023-12-24 ENCOUNTER — Encounter: Payer: Self-pay | Admitting: Psychiatry

## 2023-12-24 ENCOUNTER — Ambulatory Visit (INDEPENDENT_AMBULATORY_CARE_PROVIDER_SITE_OTHER): Payer: 59 | Admitting: Psychiatry

## 2023-12-24 DIAGNOSIS — F422 Mixed obsessional thoughts and acts: Secondary | ICD-10-CM

## 2023-12-24 DIAGNOSIS — R251 Tremor, unspecified: Secondary | ICD-10-CM

## 2023-12-24 MED ORDER — SERTRALINE HCL 100 MG PO TABS: ORAL_TABLET | ORAL | 3 refills | Status: DC

## 2023-12-24 NOTE — Progress Notes (Signed)
 Mark House [id number] [birth year] 63 y.o.  Subjective:   Patient ID:  Mark House is a 63 y.o. (DOB 09/19/1961) male.  Chief Complaint:  No chief complaint on file.   HPI Mark House presents to the office today for follow-up of OCD was referred by therapist Bob Mylan.  First seen December 18, 2019.  He was on fluvoxamine  300 mg daily.  Abilify  5 mg was added to potentiate the antidepressant to treat OCD.  Worse over the last year.  seen February 26, 2020.  The following was noted:  Switched Abilify  to HS helped with SE now none at this time.  Definietly taken an edge off the OCD.  Till experiencing OCD but it's better by about 50% better.  Would like further benefit and not at good as in the past.  Helped quickly.To further improve the OCD Abilify  was increased to 10 mg daily.  He also continued fluvoxamine  300 mg daily and clonazepam  for anxiety and OCD.  April 08, 2020 appointment the following is noted: Abilify  10 after 2 weeks it was too much so cut back to 5 mg daily. ? Depression with lack of ambition.  Tremor.   OCD is better with Abilify  and comparable to last time.  OCD 15% better with Abilify  fairly quickly.  Typically OCD time spent includes 20 min shower or handwash 3-4 times.  Contamination is focus of OCD.  Less distressed from thoughts but on his mind a good deal of the time. Plan: Too fatigued with Abilify  10.  Stop it DT SE.  If sx get worse then restart at 2 mg daily.    June 10, 2020 appointment with the following noted: Stopped Abilify  and the fatigue improved.  After a couple of weeks felt worse with the OCD so restarted Abilify  2 mg and it helped take the edge off. No SE with it.  OCD remains but Abilify  helps. Continues fluvoxamine  300 mg daily. Klonopin  about 0.5 mg daily prn. OCD more aggravating than anything and doesn't keep him from doing things. Has to check and wash hands and makes his wife a little anxious. Started job PT 1 mo ago and going  well.   Not depressed.   Plan: Start 1/2 sertraline  and reduce fluvoxamine  to 2 daily for 5 days, then Increase sertraline  to 1 daily and reduce fluvoxamine  to 1 and 1/2 tablets daily for 5 days, then Increase sertraline  to 1 and 1/2 tablets daily and reduce fluvoxamine  to 1 daily for 5 days, then Increase sertraline  to 2 daily and reduce fluvoxamine  to 1/2 tablet for 5 days, Then stop fluvoxamine .  09/14/20 appt with following noted: Probably better.  No sig SE. OCD lessened and handles it better. No sessions with [name] right now bc seemed to learn what he needed.  I need to embrace it right now.  Contamination obsessions are the meain problem.  Anxiety low moderate.  Don't get bogged down as much on showering and handwashing. 100% truthfulness obsessions can be a problem. Slight tremor ? New or worse.  Not problematic. Plan: Increase Sertraline  to 2 and 1/2 tablets daily for 2 weeks, then increase to 2 in the AM and 1 in the PM. Continue aripiprazole  2 mg daily.  02/07/2021 appointment with following noted: This appointment was delayed because it had to be rescheduled.  continued meds from above. Not too bad.  Pleased with meds.  Able to resist OCD better though it's not gone. Stopped therapy bc maxed benefit. No SE noted.  Mild covid in January without problems. Still mild trembling in hands.  1 cup coffee each AM.    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  Plan: DC Abilify  to see if tremor better  He may not need Abilify  DT benefit with sertraline .  05/18/21 appt with following noted: He missed appt in May.  Forgot it.   Doing the best I've done since before the Covid. OCD is really better.  Can resist the urges.  More easily practices the ERP.    Was on Luvox  for 20+ years until retirement and Covid.  Doing well now and pleased with sertraline .  Working PT at funeral home. Disc tremor which is not worse but maybe better. Took 1 propranolol  tablet and within a couple of hours saw  black specks lasted 5 seconds and resolved.  Wasn't dizzy.  Saw commercials about TD and wonders about it.  No one notices anything except wife under rare circumstances.  No SE sertraline . Plan: continue sertraline  300.  He stopped Abilify   09/19/21 appt noted: Takes Klonopin  rarely.  Consistent with sertraline .  No tremor med desired with better tremor.  No problems with it.  Not as anxious as in the past and OCD managed. No other SE sertraline . 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  Not severe.   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 Plan: Continue sertraline  300 mg daily  03/20/2022 appointment with the following noted: Very rare clonazepam  Consistent with sertraline  300 mg daily.  No sig SE.   Minimal tremor. Overall good as I've been in a long time.  Satisfied with meds. M 63 yo now in asst living ing and that has helped.  12/19/2022 appointment noted: OCD pretty good and overall pretty good. Largely unchanged with slight variation from time to time. Practices ERP he learded from therapist. Consistent with sertraline  300 mg daily.  No sig SE.   No sig clonazepam . No other meds. No excess alcohol.   Patient reports stable mood and denies depressed or irritable moods.   Patient denies difficulty with sleep initiation or maintenance .  Sometimes up too late at night.. Denies appetite disturbance.  Patient reports that energy and motivation have been good.  Patient denies any difficulty with concentration.  Patient denies any suicidal ideation.  12/24/23 appt noted: Still just on sertraline  300 mg daily.  No BZ needed.  No SE noted.   Anxiety and OCD managed.  Can resist urges.  Minimal OCD.   Health been stable.  No depression.  Sleep stable.   No new concerns.   Reviewed med change history.  Happy to stay right where I am. 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  Past Psychiatric Medication Trials: Anafranil remote and brief Buspirone with Luvox   ? Rash Fluvoxamine  300  Sertraline  300 Abilify  10 SE, 2 mg helped without SE.  Review of Systems:  Review of Systems  Constitutional:  Negative for fatigue.  HENT:  Negative for postnasal drip.   Cardiovascular:  Negative for palpitations.  Gastrointestinal:  Negative for diarrhea.  Genitourinary:  Positive for urgency.  Psychiatric/Behavioral:  Negative for dysphoric mood.     Medications: I have reviewed the patient's current medications.  Current Outpatient Medications  Medication Sig Dispense Refill   clonazePAM  (KLONOPIN ) 0.5 MG tablet Take 1 tablet (0.5 mg total) by mouth 2 (two) times daily. Usually takes daily (Patient taking differently: Take 0.5 mg by mouth 2 (two) times daily. PRN) 60 tablet 2  Multiple Vitamin (MULTIVITAMIN) tablet Take 1 tablet by mouth daily.     Omega 3 1200 MG CAPS Take by mouth.     sertraline  (ZOLOFT ) 100 MG tablet TAKE 2 TABLETS BY MOUTH IN THE MORNING AND 1 TABLET IN THE EVENING 270 tablet 3   No current facility-administered medications for this visit.    Medication Side Effects:  increased saliva and rhinorrhea, listless  Allergies: No Known Allergies  Past Medical History:  Diagnosis Date   OCD (obsessive compulsive disorder)    Pericarditis     Family History  Problem Relation Age of Onset   Cancer - Lung Mother    Hypertension Mother    Stroke Mother    Heart disease Father    Kidney disease Father    Cancer Maternal Grandmother     Social History   Socioeconomic History   Marital status: Married    Spouse name: Not on file   Number of children: 0   Years of education: Not on file   Highest education level: Not on file  Occupational History   Occupation: Retired  Tobacco Use   Smoking status: Never   Smokeless tobacco: Former    Quit date: 2018  Vaping Use   Vaping status: Never Used  Substance and Sexual Activity   Alcohol use: Yes    Alcohol/week: 21.0 standard drinks of alcohol    Types: 21 Cans of beer  per week   Drug use: Not Currently   Sexual activity: Yes    Partners: Female    Birth control/protection: Post-menopausal  Other Topics Concern   Not on file  Social History Narrative   Retired   Right handed   Lives at home with wife in a two story home with dog and cat.   Caffeine - I cup coffee in am   Social Drivers of Corporate Investment Banker Strain: Not on file  Food Insecurity: Not on file  Transportation Needs: Not on file  Physical Activity: Not on file  Stress: Not on file  Social Connections: Not on file  Intimate Partner Violence: Not on file    Past Medical History, Surgical history, Social history, and Family history were reviewed and updated as appropriate.   Please see review of systems for further details on the patient's review from today.   Objective:   Physical Exam:  There were no vitals taken for this visit.  Physical Exam Constitutional:      General: He is not in acute distress. Musculoskeletal:        General: No deformity.  Neurological:     Mental Status: He is alert and oriented to person, place, and time.     Coordination: Coordination normal.  Psychiatric:        Attention and Perception: Attention and perception normal. He does not perceive auditory or visual hallucinations.        Mood and Affect: Mood is anxious. Mood is not depressed. Affect is not labile, blunt, angry or inappropriate.        Speech: Speech normal.        Behavior: Behavior normal.        Thought Content: Thought content normal. Thought content is not paranoid or delusional. Thought content does not include homicidal or suicidal ideation. Thought content does not include suicidal plan.        Cognition and Memory: Cognition and memory normal.        Judgment: Judgment normal.     Comments:  Insight intact Residual OCD, contamination, handwashing showering excessively better by a great deal Much Less obsessions.      Lab Review:     Component Value  Date/Time   NA 140 05/26/2019 1040   K 4.1 05/26/2019 1040   CL 103 05/26/2019 1040   CO2 25 05/26/2019 1040   GLUCOSE 93 05/26/2019 1040   BUN 19 05/26/2019 1040   CREATININE 1.33 05/26/2019 1040   CALCIUM 10.0 05/26/2019 1040       Component Value Date/Time   WBC 5.5 05/26/2019 1040   RBC 4.90 05/26/2019 1040   HGB 16.9 05/26/2019 1040   HCT 46.7 05/26/2019 1040   PLT 144 05/26/2019 1040   MCV 95.3 05/26/2019 1040   MCH 34.5 (H) 05/26/2019 1040   MCHC 36.2 (H) 05/26/2019 1040   RDW 12.5 05/26/2019 1040   LYMPHSABS 710 (L) 05/26/2019 1040   EOSABS 50 05/26/2019 1040   BASOSABS 28 05/26/2019 1040    No results found for: POCLITH, LITHIUM   No results found for: PHENYTOIN, PHENOBARB, VALPROATE, CBMZ   .res Assessment: Plan:    There are no diagnoses linked to this encounter. Good response with switch from fluvoxamine  to sertraline  06/2020.  Stopped Abilify  and increased sertraline  to 300 mg daily.   Early 2021 OCD was debilitating. Residual OCD, contamination, handwashing showering excessively better by a great deal Much Less obsessions. As good as I've been.  Remains so.   Disc the treatment prognosis and typical outcomes with treatment.    Cont sertraline .  Disc studies about higher than usual dosages of 200 mg daily.  Disc option increasing.  He's tolerating it well.  Disc serotonergic SE in detail.   He is not willing to take risk of reducing it. continue Sertraline  2 in the AM and 1 in the PM..  We discussed the short-term risks associated with benzodiazepines including sedation and increased fall risk among others.  Discussed long-term side effect risk including dependence, potential withdrawal symptoms, and the potential eventual dose-related risk of dementia.  But recent studies from 2020 dispute this association between benzodiazepines and dementia risk.  Newwer studies refute risk. Not used significantly.  Also took prn clonazepam  for anxiety.  It  was somewhat helpful and he wants to continue it prn but rarely  Discussed the advantage of cognitive behavior therapy and is especially exposure response prevention therapy for treatment of OCD.  He uses ERP with greater success. Questions about the types of therapy for OCD.  Mild scrupulosity sx.    Disc his borderline TSH result.  Option propranolol  for tremor.  Defer.  Follow-up 12 mos   Lorene Macintosh MD, DFAPA  Please see After Visit Summary for patient specific instructions.  No future appointments.  No orders of the defined types were placed in this encounter.   -------------------------------

## 2024-09-17 ENCOUNTER — Other Ambulatory Visit: Payer: Self-pay | Admitting: Psychiatry

## 2024-09-17 DIAGNOSIS — F422 Mixed obsessional thoughts and acts: Secondary | ICD-10-CM

## 2024-10-25 ENCOUNTER — Other Ambulatory Visit: Payer: Self-pay | Admitting: Psychiatry

## 2024-10-25 DIAGNOSIS — F422 Mixed obsessional thoughts and acts: Secondary | ICD-10-CM

## 2024-12-24 ENCOUNTER — Ambulatory Visit: Payer: 59 | Admitting: Psychiatry

## 2024-12-24 ENCOUNTER — Encounter: Payer: Self-pay | Admitting: Psychiatry

## 2024-12-24 DIAGNOSIS — R251 Tremor, unspecified: Secondary | ICD-10-CM

## 2024-12-24 DIAGNOSIS — F422 Mixed obsessional thoughts and acts: Secondary | ICD-10-CM | POA: Diagnosis not present

## 2024-12-24 MED ORDER — SERTRALINE HCL 100 MG PO TABS: ORAL_TABLET | ORAL | 3 refills | Status: AC

## 2024-12-24 NOTE — Progress Notes (Signed)
 Mark House [id number] 28-Mar-1961 64 y.o.  Subjective:   Patient ID:  Mark House is a 64 y.o. (DOB [birth year]) male.  Chief Complaint:  Chief Complaint  Patient presents with   Follow-up    HPI Mark House presents to the office today for follow-up of OCD was referred by therapist Bob Mylan.  First seen December 18, 2019.  He was on fluvoxamine  300 mg daily.  Abilify  5 mg was added to potentiate the antidepressant to treat OCD.  Worse over the last year.  seen February 26, 2020.  The following was noted:  Switched Abilify  to HS helped with SE now none at this time.  Definietly taken an edge off the OCD.  Till experiencing OCD but it's better by about 50% better.  Would like further benefit and not at good as in the past.  Helped quickly.To further improve the OCD Abilify  was increased to 10 mg daily.  He also continued fluvoxamine  300 mg daily and clonazepam  for anxiety and OCD.  April 08, 2020 appointment the following is noted: Abilify  10 after 2 weeks it was too much so cut back to 5 mg daily. ? Depression with lack of ambition.  Tremor.   OCD is better with Abilify  and comparable to last time.  OCD 15% better with Abilify  fairly quickly.  Typically OCD time spent includes 20 min shower or handwash 3-4 times.  Contamination is focus of OCD.  Less distressed from thoughts but on his mind a good deal of the time. Plan: Too fatigued with Abilify  10.  Stop it DT SE.  If sx get worse then restart at 2 mg daily.    June 10, 2020 appointment with the following noted: Stopped Abilify  and the fatigue improved.  After a couple of weeks felt worse with the OCD so restarted Abilify  2 mg and it helped take the edge off. No SE with it.  OCD remains but Abilify  helps. Continues fluvoxamine  300 mg daily. Klonopin  about 0.5 mg daily prn. OCD more aggravating than anything and doesn't keep him from doing things. Has to check and wash hands and makes his wife a little anxious. Started  job PT 1 mo ago and going well.   Not depressed.   Plan: Start 1/2 sertraline  and reduce fluvoxamine  to 2 daily for 5 days, then Increase sertraline  to 1 daily and reduce fluvoxamine  to 1 and 1/2 tablets daily for 5 days, then Increase sertraline  to 1 and 1/2 tablets daily and reduce fluvoxamine  to 1 daily for 5 days, then Increase sertraline  to 2 daily and reduce fluvoxamine  to 1/2 tablet for 5 days, Then stop fluvoxamine .  09/14/20 appt with following noted: Probably better.  No sig SE. OCD lessened and handles it better. No sessions with [name] right now bc seemed to learn what he needed.  I need to embrace it right now.  Contamination obsessions are the meain problem.  Anxiety low moderate.  Don't get bogged down as much on showering and handwashing. 100% truthfulness obsessions can be a problem. Slight tremor ? New or worse.  Not problematic. Plan: Increase Sertraline  to 2 and 1/2 tablets daily for 2 weeks, then increase to 2 in the AM and 1 in the PM. Continue aripiprazole  2 mg daily.  02/07/2021 appointment with following noted: This appointment was delayed because it had to be rescheduled.  continued meds from above. Not too bad.  Pleased with meds.  Able to resist OCD better though it's not gone. Stopped therapy bc  maxed benefit. No SE noted.    Mild covid in January without problems. Still mild trembling in hands.  1 cup coffee each AM.    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  Plan: DC Abilify  to see if tremor better  He may not need Abilify  DT benefit with sertraline .  05/18/21 appt with following noted: He missed appt in May.  Forgot it.   Doing the best I've done since before the Covid. OCD is really better.  Can resist the urges.  More easily practices the ERP.    Was on Luvox  for 20+ years until retirement and Covid.  Doing well now and pleased with sertraline .  Working PT at funeral home. Disc tremor which is not worse but maybe better. Took 1 propranolol  tablet and within  a couple of hours saw black specks lasted 5 seconds and resolved.  Wasn't dizzy.  Saw commercials about TD and wonders about it.  No one notices anything except wife under rare circumstances.  No SE sertraline . Plan: continue sertraline  300.  He stopped Abilify   09/19/21 appt noted: Takes Klonopin  rarely.  Consistent with sertraline .  No tremor med desired with better tremor.  No problems with it.  Not as anxious as in the past and OCD managed. No other SE sertraline . 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  Not severe.   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 Plan: Continue sertraline  300 mg daily  03/20/2022 appointment with the following noted: Very rare clonazepam  Consistent with sertraline  300 mg daily.  No sig SE.   Minimal tremor. Overall good as I've been in a long time.  Satisfied with meds. M 64 yo now in asst living ing and that has helped.  12/19/2022 appointment noted: OCD pretty good and overall pretty good. Largely unchanged with slight variation from time to time. Practices ERP he learded from therapist. Consistent with sertraline  300 mg daily.  No sig SE.   No sig clonazepam . No other meds. No excess alcohol.   Patient reports stable mood and denies depressed or irritable moods.   Patient denies difficulty with sleep initiation or maintenance .  Sometimes up too late at night.. Denies appetite disturbance.  Patient reports that energy and motivation have been good.  Patient denies any difficulty with concentration.  Patient denies any suicidal ideation.  12/24/23 appt noted: Still just on sertraline  300 mg daily.  No BZ needed.  No SE noted.   Anxiety and OCD managed.  Can resist urges.  Minimal OCD.   Health been stable.  No depression.  Sleep stable.   No new concerns.   Reviewed med change history.  Happy to stay right where I am. 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  01-08-2025 appt noted:  sertraline  300 mg daily.  No SE  concerns.   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  Been a lot on me.  Still dealing with it.   Mild increase OCD lately but manageable.  Able to resist compulsions generally.  Disc nephew's OCD.  Family spent a lot of money on him.  Huge $ burden.  Nephew self medicating.  OCD manageable and doesn't interfere with activities and anxiety managed.  No excessive washing.   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     Past Psychiatric Medication Trials: Anafranil remote and brief Buspirone with Luvox  ? Rash Fluvoxamine  300  Sertraline  300 Abilify  10 SE, 2 mg helped without SE.  B's  youngest B bad OCD.    Review of Systems:  Review of Systems  Constitutional:  Negative for fatigue.  HENT:  Negative for postnasal drip.   Cardiovascular:  Negative for palpitations.  Gastrointestinal:  Negative for diarrhea.  Genitourinary:  Positive for urgency.  Psychiatric/Behavioral:  Negative for dysphoric mood.     Medications: I have reviewed the patient's current medications.  Current Outpatient Medications  Medication Sig Dispense Refill   Multiple Vitamin (MULTIVITAMIN) tablet Take 1 tablet by mouth daily.     Omega 3 1200 MG CAPS Take by mouth.     clonazePAM  (KLONOPIN ) 0.5 MG tablet Take 1 tablet (0.5 mg total) by mouth 2 (two) times daily. Usually takes daily (Patient not taking: Reported on 12/24/2023) 60 tablet 2   sertraline  (ZOLOFT ) 100 MG tablet TAKE 2 TABLETS IN THE MORNING AND 1 TABLET IN THE EVENING 270 tablet 3   No current facility-administered medications for this visit.    Medication Side Effects:  increased saliva and rhinorrhea, listless  Allergies: No Known Allergies  Past Medical History:  Diagnosis Date   OCD (obsessive compulsive disorder)    Pericarditis     Family History  Problem Relation Age of Onset   Cancer - Lung Mother    Hypertension Mother    Stroke Mother    Heart disease Father    Kidney disease Father    Cancer  Maternal Grandmother     Social History   Socioeconomic History   Marital status: Married    Spouse name: Not on file   Number of children: 0   Years of education: Not on file   Highest education level: Not on file  Occupational History   Occupation: Retired  Tobacco Use   Smoking status: Never   Smokeless tobacco: Former    Quit date: 2018  Vaping Use   Vaping status: Never Used  Substance and Sexual Activity   Alcohol use: Yes    Alcohol/week: 21.0 standard drinks of alcohol    Types: 21 Cans of beer per week   Drug use: Not Currently   Sexual activity: Yes    Partners: Female    Birth control/protection: Post-menopausal  Other Topics Concern   Not on file  Social History Narrative   Retired   Right handed   Lives at home with wife in a two story home with dog and cat.   Caffeine - I cup coffee in am   Social Drivers of Health   Tobacco Use: Medium Risk (12/24/2024)   Patient History    Smoking Tobacco Use: Never    Smokeless Tobacco Use: Former    Passive Exposure: Not on Actuary Strain: Not on file  Food Insecurity: Not on file  Transportation Needs: Not on file  Physical Activity: Not on file  Stress: Not on file  Social Connections: Not on file  Intimate Partner Violence: Not on file  Depression (EYV7-0): Not on file  Alcohol Screen: Not on file  Housing: Not on file  Utilities: Not on file  Health Literacy: Not on file    Past Medical History, Surgical history, Social history, and Family history were reviewed and updated as appropriate.   Please see review of systems for further details on the patient's review from today.   Objective:   Physical Exam:  There were no vitals taken for this visit.  Physical Exam Constitutional:      General: He is not in acute distress. Musculoskeletal:  General: No deformity.  Neurological:     Mental Status: He is alert and oriented to person, place, and time.     Coordination:  Coordination normal.  Psychiatric:        Attention and Perception: Attention and perception normal. He does not perceive auditory or visual hallucinations.        Mood and Affect: Mood is anxious. Mood is not depressed. Affect is not labile, blunt, angry or inappropriate.        Speech: Speech normal.        Behavior: Behavior normal.        Thought Content: Thought content normal. Thought content is not paranoid or delusional. Thought content does not include homicidal or suicidal ideation. Thought content does not include suicidal plan.        Cognition and Memory: Cognition and memory normal.        Judgment: Judgment normal.     Comments: Insight intact Residual OCD, contamination, handwashing showering excessively better by a great deal Much Less obsessions an dmanageable.      Lab Review:     Component Value Date/Time   NA 140 05/26/2019 1040   K 4.1 05/26/2019 1040   CL 103 05/26/2019 1040   CO2 25 05/26/2019 1040   GLUCOSE 93 05/26/2019 1040   BUN 19 05/26/2019 1040   CREATININE 1.33 05/26/2019 1040   CALCIUM 10.0 05/26/2019 1040       Component Value Date/Time   WBC 5.5 05/26/2019 1040   RBC 4.90 05/26/2019 1040   HGB 16.9 05/26/2019 1040   HCT 46.7 05/26/2019 1040   PLT 144 05/26/2019 1040   MCV 95.3 05/26/2019 1040   MCH 34.5 (H) 05/26/2019 1040   MCHC 36.2 (H) 05/26/2019 1040   RDW 12.5 05/26/2019 1040   LYMPHSABS 710 (L) 05/26/2019 1040   EOSABS 50 05/26/2019 1040   BASOSABS 28 05/26/2019 1040    No results found for: POCLITH, LITHIUM   No results found for: PHENYTOIN, PHENOBARB, VALPROATE, CBMZ   .res Assessment: Plan:    Daiquan was seen today for follow-up.  Diagnoses and all orders for this visit:  Mixed obsessional thoughts and acts -     sertraline  (ZOLOFT ) 100 MG tablet; TAKE 2 TABLETS IN THE MORNING AND 1 TABLET IN THE EVENING  Tremor of both outstretched hands   Good response with switch from fluvoxamine  to sertraline   06/2020.  Stopped Abilify  and increased sertraline  to 300 mg daily.   Early 2021 OCD was debilitating. Residual OCD, contamination, handwashing showering excessively better by a great deal Much Less obsessions.  Mild scrupulosity sx.   As good as I've been.  Remains so.   Disc the treatment prognosis and typical outcomes with treatment.   Active in retirement helps OCD and anxiety.    Cont sertraline .  Disc studies about higher than usual dosages of 200 mg daily.  Disc option increasing.  He's tolerating it well.  Disc serotonergic SE in detail.   He prefers not to take risk of reducing it. continue Sertraline  2 in the AM and 1 in the PM longterm.  We discussed the short-term risks associated with benzodiazepines including sedation and increased fall risk among others.  Discussed long-term side effect risk including dependence, potential withdrawal symptoms, and the potential eventual dose-related risk of dementia.  But recent studies from 2020 dispute this association between benzodiazepines and dementia risk.  Newwer studies refute risk. Not used significantly.  Also took prn clonazepam  for anxiety.  It  was somewhat helpful and he wants to continue it prn but rarely  Option propranolol  for tremor.  Defer.  Follow-up 12 mos   Lorene Macintosh MD, DFAPA  Please see After Visit Summary for patient specific instructions.  No future appointments.  No orders of the defined types were placed in this encounter.   -------------------------------

## 2025-12-24 ENCOUNTER — Ambulatory Visit: Admitting: Psychiatry
